# Patient Record
Sex: Female | Born: 1945 | Race: White | Hispanic: No | Marital: Married | Smoking: Never smoker
Health system: Southern US, Community
[De-identification: ages and names within clinical notes are randomized; demographics above are authoritative.]

## PROBLEM LIST (undated history)

## (undated) DIAGNOSIS — C50911 Malignant neoplasm of unspecified site of right female breast: Secondary | ICD-10-CM

## (undated) DIAGNOSIS — Z923 Personal history of irradiation: Secondary | ICD-10-CM

## (undated) DIAGNOSIS — C50919 Malignant neoplasm of unspecified site of unspecified female breast: Secondary | ICD-10-CM

## (undated) DIAGNOSIS — Z9221 Personal history of antineoplastic chemotherapy: Secondary | ICD-10-CM

## (undated) HISTORY — DX: Malignant neoplasm of unspecified site of right female breast: C50.911

---

## 2001-04-18 ENCOUNTER — Encounter: Admission: RE | Admit: 2001-04-18 | Discharge: 2001-04-18 | Payer: Self-pay | Admitting: Family Medicine

## 2001-04-18 ENCOUNTER — Encounter: Payer: Self-pay | Admitting: Family Medicine

## 2001-07-24 ENCOUNTER — Ambulatory Visit (HOSPITAL_COMMUNITY): Admission: RE | Admit: 2001-07-24 | Discharge: 2001-07-24 | Payer: Self-pay | Admitting: Gastroenterology

## 2002-04-25 ENCOUNTER — Encounter: Admission: RE | Admit: 2002-04-25 | Discharge: 2002-04-25 | Payer: Self-pay | Admitting: Family Medicine

## 2002-04-25 ENCOUNTER — Encounter: Payer: Self-pay | Admitting: Family Medicine

## 2003-06-25 ENCOUNTER — Encounter: Admission: RE | Admit: 2003-06-25 | Discharge: 2003-06-25 | Payer: Self-pay | Admitting: Family Medicine

## 2003-12-19 ENCOUNTER — Encounter: Admission: RE | Admit: 2003-12-19 | Discharge: 2003-12-19 | Payer: Self-pay | Admitting: Family Medicine

## 2004-07-21 ENCOUNTER — Encounter: Admission: RE | Admit: 2004-07-21 | Discharge: 2004-07-21 | Payer: Self-pay | Admitting: Family Medicine

## 2005-09-12 ENCOUNTER — Encounter: Admission: RE | Admit: 2005-09-12 | Discharge: 2005-09-12 | Payer: Self-pay | Admitting: Family Medicine

## 2006-02-08 ENCOUNTER — Encounter: Admission: RE | Admit: 2006-02-08 | Discharge: 2006-02-08 | Payer: Self-pay | Admitting: Family Medicine

## 2006-09-18 ENCOUNTER — Encounter: Admission: RE | Admit: 2006-09-18 | Discharge: 2006-09-18 | Payer: Self-pay | Admitting: Family Medicine

## 2007-09-18 ENCOUNTER — Ambulatory Visit (HOSPITAL_COMMUNITY): Admission: RE | Admit: 2007-09-18 | Discharge: 2007-09-19 | Payer: Self-pay | Admitting: Obstetrics and Gynecology

## 2007-12-11 ENCOUNTER — Encounter: Admission: RE | Admit: 2007-12-11 | Discharge: 2007-12-11 | Payer: Self-pay | Admitting: Obstetrics and Gynecology

## 2008-12-11 ENCOUNTER — Encounter: Admission: RE | Admit: 2008-12-11 | Discharge: 2008-12-11 | Payer: Self-pay | Admitting: Obstetrics and Gynecology

## 2009-04-16 ENCOUNTER — Encounter: Admission: RE | Admit: 2009-04-16 | Discharge: 2009-04-16 | Payer: Self-pay | Admitting: Family Medicine

## 2009-07-25 DIAGNOSIS — Z9221 Personal history of antineoplastic chemotherapy: Secondary | ICD-10-CM

## 2009-07-25 DIAGNOSIS — Z923 Personal history of irradiation: Secondary | ICD-10-CM

## 2009-07-25 DIAGNOSIS — C50919 Malignant neoplasm of unspecified site of unspecified female breast: Secondary | ICD-10-CM

## 2009-07-25 HISTORY — DX: Personal history of antineoplastic chemotherapy: Z92.21

## 2009-07-25 HISTORY — DX: Malignant neoplasm of unspecified site of unspecified female breast: C50.919

## 2009-07-25 HISTORY — DX: Personal history of irradiation: Z92.3

## 2009-07-25 HISTORY — PX: BREAST LUMPECTOMY: SHX2

## 2009-08-14 ENCOUNTER — Encounter: Admission: RE | Admit: 2009-08-14 | Discharge: 2009-08-14 | Payer: Self-pay | Admitting: Obstetrics and Gynecology

## 2009-08-21 ENCOUNTER — Encounter: Admission: RE | Admit: 2009-08-21 | Discharge: 2009-08-21 | Payer: Self-pay | Admitting: Obstetrics and Gynecology

## 2009-08-24 ENCOUNTER — Ambulatory Visit: Payer: Self-pay | Admitting: Oncology

## 2009-09-04 ENCOUNTER — Encounter: Admission: RE | Admit: 2009-09-04 | Discharge: 2009-09-04 | Payer: Self-pay | Admitting: Surgery

## 2009-09-04 ENCOUNTER — Ambulatory Visit (HOSPITAL_COMMUNITY): Admission: RE | Admit: 2009-09-04 | Discharge: 2009-09-04 | Payer: Self-pay | Admitting: Surgery

## 2009-09-09 LAB — COMPREHENSIVE METABOLIC PANEL
ALT: 56 U/L — ABNORMAL HIGH (ref 0–35)
BUN: 17 mg/dL (ref 6–23)
CO2: 28 mEq/L (ref 19–32)
Calcium: 10.1 mg/dL (ref 8.4–10.5)
Chloride: 103 mEq/L (ref 96–112)
Creatinine, Ser: 0.73 mg/dL (ref 0.40–1.20)
Total Bilirubin: 0.5 mg/dL (ref 0.3–1.2)

## 2009-09-09 LAB — CBC WITH DIFFERENTIAL/PLATELET
Basophils Absolute: 0 10*3/uL (ref 0.0–0.1)
EOS%: 0.9 % (ref 0.0–7.0)
Eosinophils Absolute: 0 10*3/uL (ref 0.0–0.5)
HCT: 37.9 % (ref 34.8–46.6)
MCHC: 33.8 g/dL (ref 31.5–36.0)
MCV: 90.8 fL (ref 79.5–101.0)
Platelets: 183 10*3/uL (ref 145–400)
RDW: 14 % (ref 11.2–14.5)
WBC: 5.4 10*3/uL (ref 3.9–10.3)
lymph#: 0.8 10*3/uL — ABNORMAL LOW (ref 0.9–3.3)

## 2009-09-09 LAB — CANCER ANTIGEN 27.29: CA 27.29: 17 U/mL (ref 0–39)

## 2009-09-15 ENCOUNTER — Ambulatory Visit (HOSPITAL_COMMUNITY): Admission: RE | Admit: 2009-09-15 | Discharge: 2009-09-15 | Payer: Self-pay | Admitting: Oncology

## 2009-09-17 ENCOUNTER — Ambulatory Visit (HOSPITAL_COMMUNITY): Admission: RE | Admit: 2009-09-17 | Discharge: 2009-09-17 | Payer: Self-pay | Admitting: Oncology

## 2009-09-17 ENCOUNTER — Encounter: Payer: Self-pay | Admitting: Oncology

## 2009-09-23 ENCOUNTER — Ambulatory Visit (HOSPITAL_COMMUNITY): Admission: RE | Admit: 2009-09-23 | Discharge: 2009-09-23 | Payer: Self-pay | Admitting: Surgery

## 2009-09-25 ENCOUNTER — Ambulatory Visit: Payer: Self-pay | Admitting: Oncology

## 2009-10-13 LAB — CBC WITH DIFFERENTIAL/PLATELET
Basophils Absolute: 0 10*3/uL (ref 0.0–0.1)
EOS%: 2.3 % (ref 0.0–7.0)
Eosinophils Absolute: 0 10*3/uL (ref 0.0–0.5)
HCT: 34.4 % — ABNORMAL LOW (ref 34.8–46.6)
HGB: 11.2 g/dL — ABNORMAL LOW (ref 11.6–15.9)
MCH: 29.5 pg (ref 25.1–34.0)
MONO#: 0.3 10*3/uL (ref 0.1–0.9)
NEUT#: 0.7 10*3/uL — ABNORMAL LOW (ref 1.5–6.5)
NEUT%: 36.6 % — ABNORMAL LOW (ref 38.4–76.8)
RDW: 12.9 % (ref 11.2–14.5)
lymph#: 0.8 10*3/uL — ABNORMAL LOW (ref 0.9–3.3)

## 2009-10-20 LAB — CBC WITH DIFFERENTIAL/PLATELET
Basophils Absolute: 0 10*3/uL (ref 0.0–0.1)
Eosinophils Absolute: 0.1 10*3/uL (ref 0.0–0.5)
HCT: 35.7 % (ref 34.8–46.6)
HGB: 12 g/dL (ref 11.6–15.9)
LYMPH%: 14.7 % (ref 14.0–49.7)
MCV: 91.4 fL (ref 79.5–101.0)
MONO#: 0.6 10*3/uL (ref 0.1–0.9)
MONO%: 7 % (ref 0.0–14.0)
NEUT#: 6.3 10*3/uL (ref 1.5–6.5)
NEUT%: 76.9 % — ABNORMAL HIGH (ref 38.4–76.8)
Platelets: 218 10*3/uL (ref 145–400)
RBC: 3.91 10*6/uL (ref 3.70–5.45)
WBC: 8.2 10*3/uL (ref 3.9–10.3)

## 2009-10-26 ENCOUNTER — Ambulatory Visit: Payer: Self-pay | Admitting: Oncology

## 2009-10-26 LAB — CBC WITH DIFFERENTIAL/PLATELET
Basophils Absolute: 0 10*3/uL (ref 0.0–0.1)
EOS%: 0 % (ref 0.0–7.0)
Eosinophils Absolute: 0 10*3/uL (ref 0.0–0.5)
HCT: 35.8 % (ref 34.8–46.6)
HGB: 12 g/dL (ref 11.6–15.9)
MCH: 30.6 pg (ref 25.1–34.0)
NEUT%: 94.7 % — ABNORMAL HIGH (ref 38.4–76.8)
lymph#: 0.5 10*3/uL — ABNORMAL LOW (ref 0.9–3.3)

## 2009-10-27 LAB — COMPREHENSIVE METABOLIC PANEL
AST: 22 U/L (ref 0–37)
BUN: 17 mg/dL (ref 6–23)
CO2: 26 mEq/L (ref 19–32)
Calcium: 9.8 mg/dL (ref 8.4–10.5)
Chloride: 103 mEq/L (ref 96–112)
Creatinine, Ser: 0.69 mg/dL (ref 0.40–1.20)
Glucose, Bld: 100 mg/dL — ABNORMAL HIGH (ref 70–99)

## 2009-11-03 LAB — CBC WITH DIFFERENTIAL/PLATELET
Basophils Absolute: 0 10*3/uL (ref 0.0–0.1)
Eosinophils Absolute: 0 10*3/uL (ref 0.0–0.5)
HCT: 31.7 % — ABNORMAL LOW (ref 34.8–46.6)
HGB: 10.9 g/dL — ABNORMAL LOW (ref 11.6–15.9)
MONO#: 0.2 10*3/uL (ref 0.1–0.9)
NEUT#: 0.5 10*3/uL — ABNORMAL LOW (ref 1.5–6.5)
NEUT%: 32.4 % — ABNORMAL LOW (ref 38.4–76.8)
RDW: 14.3 % (ref 11.2–14.5)
WBC: 1.4 10*3/uL — ABNORMAL LOW (ref 3.9–10.3)
lymph#: 0.7 10*3/uL — ABNORMAL LOW (ref 0.9–3.3)

## 2009-11-17 LAB — CBC WITH DIFFERENTIAL/PLATELET
Basophils Absolute: 0 10*3/uL (ref 0.0–0.1)
EOS%: 0 % (ref 0.0–7.0)
Eosinophils Absolute: 0 10*3/uL (ref 0.0–0.5)
HCT: 35.1 % (ref 34.8–46.6)
HGB: 11.5 g/dL — ABNORMAL LOW (ref 11.6–15.9)
MCH: 30 pg (ref 25.1–34.0)
MCV: 91.6 fL (ref 79.5–101.0)
MONO%: 6.7 % (ref 0.0–14.0)
NEUT#: 11.5 10*3/uL — ABNORMAL HIGH (ref 1.5–6.5)
NEUT%: 86 % — ABNORMAL HIGH (ref 38.4–76.8)

## 2009-11-17 LAB — COMPREHENSIVE METABOLIC PANEL
AST: 20 U/L (ref 0–37)
Albumin: 4.3 g/dL (ref 3.5–5.2)
Alkaline Phosphatase: 51 U/L (ref 39–117)
BUN: 18 mg/dL (ref 6–23)
Calcium: 10 mg/dL (ref 8.4–10.5)
Chloride: 104 mEq/L (ref 96–112)
Creatinine, Ser: 0.67 mg/dL (ref 0.40–1.20)
Glucose, Bld: 98 mg/dL (ref 70–99)
Potassium: 4.1 mEq/L (ref 3.5–5.3)

## 2009-11-24 LAB — CBC WITH DIFFERENTIAL/PLATELET
Basophils Absolute: 0 10*3/uL (ref 0.0–0.1)
EOS%: 0.9 % (ref 0.0–7.0)
HCT: 30.9 % — ABNORMAL LOW (ref 34.8–46.6)
HGB: 10.5 g/dL — ABNORMAL LOW (ref 11.6–15.9)
MCH: 31.2 pg (ref 25.1–34.0)
MCV: 92 fL (ref 79.5–101.0)
MONO%: 19.3 % — ABNORMAL HIGH (ref 0.0–14.0)
NEUT%: 35.9 % — ABNORMAL LOW (ref 38.4–76.8)
Platelets: 79 10*3/uL — ABNORMAL LOW (ref 145–400)

## 2009-12-07 ENCOUNTER — Ambulatory Visit: Payer: Self-pay | Admitting: Oncology

## 2009-12-08 LAB — COMPREHENSIVE METABOLIC PANEL
BUN: 19 mg/dL (ref 6–23)
CO2: 22 mEq/L (ref 19–32)
Calcium: 8.5 mg/dL (ref 8.4–10.5)
Chloride: 108 mEq/L (ref 96–112)
Creatinine, Ser: 0.62 mg/dL (ref 0.40–1.20)

## 2009-12-08 LAB — CBC WITH DIFFERENTIAL/PLATELET
Basophils Absolute: 0 10*3/uL (ref 0.0–0.1)
HCT: 34 % — ABNORMAL LOW (ref 34.8–46.6)
HGB: 11.2 g/dL — ABNORMAL LOW (ref 11.6–15.9)
MCH: 30.7 pg (ref 25.1–34.0)
MONO#: 0.7 10*3/uL (ref 0.1–0.9)
NEUT%: 87.7 % — ABNORMAL HIGH (ref 38.4–76.8)
WBC: 12.2 10*3/uL — ABNORMAL HIGH (ref 3.9–10.3)
lymph#: 0.8 10*3/uL — ABNORMAL LOW (ref 0.9–3.3)

## 2009-12-15 LAB — CBC WITH DIFFERENTIAL/PLATELET
BASO%: 0.9 % (ref 0.0–2.0)
Basophils Absolute: 0 10*3/uL (ref 0.0–0.1)
HCT: 29.3 % — ABNORMAL LOW (ref 34.8–46.6)
HGB: 10.1 g/dL — ABNORMAL LOW (ref 11.6–15.9)
MONO#: 0.2 10*3/uL (ref 0.1–0.9)
NEUT%: 43.8 % (ref 38.4–76.8)
WBC: 1.2 10*3/uL — ABNORMAL LOW (ref 3.9–10.3)
lymph#: 0.4 10*3/uL — ABNORMAL LOW (ref 0.9–3.3)

## 2009-12-29 LAB — CBC WITH DIFFERENTIAL/PLATELET
BASO%: 0.3 % (ref 0.0–2.0)
EOS%: 0 % (ref 0.0–7.0)
HCT: 32.9 % — ABNORMAL LOW (ref 34.8–46.6)
LYMPH%: 5.6 % — ABNORMAL LOW (ref 14.0–49.7)
MCH: 30.3 pg (ref 25.1–34.0)
MCHC: 31.9 g/dL (ref 31.5–36.0)
MONO%: 4.1 % (ref 0.0–14.0)
NEUT%: 90 % — ABNORMAL HIGH (ref 38.4–76.8)
Platelets: 332 10*3/uL (ref 145–400)
RBC: 3.46 10*6/uL — ABNORMAL LOW (ref 3.70–5.45)
WBC: 7.8 10*3/uL (ref 3.9–10.3)
nRBC: 0 % (ref 0–0)

## 2009-12-29 LAB — COMPREHENSIVE METABOLIC PANEL
ALT: 16 U/L (ref 0–35)
AST: 22 U/L (ref 0–37)
Alkaline Phosphatase: 56 U/L (ref 39–117)
BUN: 11 mg/dL (ref 6–23)
Calcium: 9.5 mg/dL (ref 8.4–10.5)
Creatinine, Ser: 0.59 mg/dL (ref 0.40–1.20)
Total Bilirubin: 0.6 mg/dL (ref 0.3–1.2)

## 2010-01-05 LAB — CBC WITH DIFFERENTIAL/PLATELET
Basophils Absolute: 0 10*3/uL (ref 0.0–0.1)
EOS%: 3.1 % (ref 0.0–7.0)
HCT: 28 % — ABNORMAL LOW (ref 34.8–46.6)
HGB: 9.6 g/dL — ABNORMAL LOW (ref 11.6–15.9)
LYMPH%: 28 % (ref 14.0–49.7)
MCH: 32.2 pg (ref 25.1–34.0)
MCV: 93.8 fL (ref 79.5–101.0)
NEUT%: 51.1 % (ref 38.4–76.8)
Platelets: 100 10*3/uL — ABNORMAL LOW (ref 145–400)
lymph#: 0.4 10*3/uL — ABNORMAL LOW (ref 0.9–3.3)

## 2010-01-18 ENCOUNTER — Ambulatory Visit: Payer: Self-pay | Admitting: Oncology

## 2010-01-19 LAB — CBC WITH DIFFERENTIAL/PLATELET
Basophils Absolute: 0 10*3/uL (ref 0.0–0.1)
EOS%: 0 % (ref 0.0–7.0)
HCT: 32 % — ABNORMAL LOW (ref 34.8–46.6)
HGB: 10 g/dL — ABNORMAL LOW (ref 11.6–15.9)
MCH: 30 pg (ref 25.1–34.0)
MCHC: 31.3 g/dL — ABNORMAL LOW (ref 31.5–36.0)
MCV: 96.1 fL (ref 79.5–101.0)
MONO%: 7.6 % (ref 0.0–14.0)
NEUT%: 83.9 % — ABNORMAL HIGH (ref 38.4–76.8)
RDW: 15.1 % — ABNORMAL HIGH (ref 11.2–14.5)

## 2010-01-19 LAB — COMPREHENSIVE METABOLIC PANEL
AST: 23 U/L (ref 0–37)
Alkaline Phosphatase: 49 U/L (ref 39–117)
BUN: 14 mg/dL (ref 6–23)
Creatinine, Ser: 0.65 mg/dL (ref 0.40–1.20)

## 2010-01-27 LAB — COMPREHENSIVE METABOLIC PANEL
ALT: 8 U/L (ref 0–35)
AST: 10 U/L (ref 0–37)
CO2: 28 mEq/L (ref 19–32)
Calcium: 9 mg/dL (ref 8.4–10.5)
Chloride: 102 mEq/L (ref 96–112)
Potassium: 4.3 mEq/L (ref 3.5–5.3)
Sodium: 139 mEq/L (ref 135–145)
Total Protein: 5.4 g/dL — ABNORMAL LOW (ref 6.0–8.3)

## 2010-01-27 LAB — CBC WITH DIFFERENTIAL/PLATELET
BASO%: 0.6 % (ref 0.0–2.0)
Eosinophils Absolute: 0 10*3/uL (ref 0.0–0.5)
LYMPH%: 16 % (ref 14.0–49.7)
MCHC: 31.3 g/dL — ABNORMAL LOW (ref 31.5–36.0)
MONO#: 0.6 10*3/uL (ref 0.1–0.9)
NEUT#: 2.1 10*3/uL (ref 1.5–6.5)
Platelets: 82 10*3/uL — ABNORMAL LOW (ref 145–400)
RBC: 3.1 10*6/uL — ABNORMAL LOW (ref 3.70–5.45)
RDW: 14.5 % (ref 11.2–14.5)
WBC: 3.2 10*3/uL — ABNORMAL LOW (ref 3.9–10.3)
lymph#: 0.5 10*3/uL — ABNORMAL LOW (ref 0.9–3.3)
nRBC: 0 % (ref 0–0)

## 2010-02-02 LAB — CBC WITH DIFFERENTIAL/PLATELET
BASO%: 0.2 % (ref 0.0–2.0)
EOS%: 0.1 % (ref 0.0–7.0)
LYMPH%: 8.1 % — ABNORMAL LOW (ref 14.0–49.7)
MCH: 31.7 pg (ref 25.1–34.0)
MCHC: 33.4 g/dL (ref 31.5–36.0)
MONO#: 0.5 10*3/uL (ref 0.1–0.9)
NEUT%: 84.1 % — ABNORMAL HIGH (ref 38.4–76.8)
RBC: 3.25 10*6/uL — ABNORMAL LOW (ref 3.70–5.45)
WBC: 6.9 10*3/uL (ref 3.9–10.3)
lymph#: 0.6 10*3/uL — ABNORMAL LOW (ref 0.9–3.3)

## 2010-02-02 LAB — COMPREHENSIVE METABOLIC PANEL
ALT: 8 U/L (ref 0–35)
AST: 14 U/L (ref 0–37)
CO2: 27 mEq/L (ref 19–32)
Chloride: 104 mEq/L (ref 96–112)
Creatinine, Ser: 0.73 mg/dL (ref 0.40–1.20)
Sodium: 142 mEq/L (ref 135–145)
Total Bilirubin: 0.3 mg/dL (ref 0.3–1.2)
Total Protein: 6 g/dL (ref 6.0–8.3)

## 2010-02-16 ENCOUNTER — Ambulatory Visit: Admission: RE | Admit: 2010-02-16 | Discharge: 2010-04-21 | Payer: Self-pay | Admitting: Radiation Oncology

## 2010-04-03 ENCOUNTER — Inpatient Hospital Stay (HOSPITAL_COMMUNITY): Admission: EM | Admit: 2010-04-03 | Discharge: 2010-04-04 | Payer: Self-pay | Admitting: Emergency Medicine

## 2010-04-13 ENCOUNTER — Ambulatory Visit: Payer: Self-pay | Admitting: Oncology

## 2010-04-15 LAB — COMPREHENSIVE METABOLIC PANEL
ALT: 21 U/L (ref 0–35)
AST: 23 U/L (ref 0–37)
Alkaline Phosphatase: 78 U/L (ref 39–117)
CO2: 33 mEq/L — ABNORMAL HIGH (ref 19–32)
Creatinine, Ser: 0.75 mg/dL (ref 0.40–1.20)
Sodium: 142 mEq/L (ref 135–145)
Total Bilirubin: 0.7 mg/dL (ref 0.3–1.2)

## 2010-04-15 LAB — CBC WITH DIFFERENTIAL/PLATELET
Eosinophils Absolute: 0.1 10*3/uL (ref 0.0–0.5)
HGB: 12 g/dL (ref 11.6–15.9)
MCV: 89.9 fL (ref 79.5–101.0)
NEUT#: 3.1 10*3/uL (ref 1.5–6.5)
RBC: 4.04 10*6/uL (ref 3.70–5.45)
RDW: 13.5 % (ref 11.2–14.5)
WBC: 4.2 10*3/uL (ref 3.9–10.3)
lymph#: 0.7 10*3/uL — ABNORMAL LOW (ref 0.9–3.3)

## 2010-04-16 LAB — CANCER ANTIGEN 27.29: CA 27.29: 30 U/mL (ref 0–39)

## 2010-04-16 LAB — VITAMIN D 25 HYDROXY (VIT D DEFICIENCY, FRACTURES): Vit D, 25-Hydroxy: 52 ng/mL (ref 30–89)

## 2010-05-06 ENCOUNTER — Ambulatory Visit (HOSPITAL_COMMUNITY): Admission: RE | Admit: 2010-05-06 | Discharge: 2010-05-06 | Payer: Self-pay | Admitting: Surgery

## 2010-06-18 ENCOUNTER — Ambulatory Visit: Payer: Self-pay | Admitting: Oncology

## 2010-06-22 LAB — CBC WITH DIFFERENTIAL/PLATELET
Basophils Absolute: 0 10*3/uL (ref 0.0–0.1)
EOS%: 1.3 % (ref 0.0–7.0)
Eosinophils Absolute: 0.1 10*3/uL (ref 0.0–0.5)
HCT: 35 % (ref 34.8–46.6)
HGB: 11.9 g/dL (ref 11.6–15.9)
MCH: 30.5 pg (ref 25.1–34.0)
MONO#: 0.4 10*3/uL (ref 0.1–0.9)
NEUT#: 3.4 10*3/uL (ref 1.5–6.5)
NEUT%: 70.7 % (ref 38.4–76.8)
RDW: 13.7 % (ref 11.2–14.5)
lymph#: 0.9 10*3/uL (ref 0.9–3.3)

## 2010-08-14 ENCOUNTER — Other Ambulatory Visit: Payer: Self-pay | Admitting: Oncology

## 2010-08-14 DIAGNOSIS — Z853 Personal history of malignant neoplasm of breast: Secondary | ICD-10-CM

## 2010-08-14 DIAGNOSIS — Z780 Asymptomatic menopausal state: Secondary | ICD-10-CM

## 2010-10-07 LAB — CBC
Hemoglobin: 12 g/dL (ref 12.0–15.0)
Hemoglobin: 12.4 g/dL (ref 12.0–15.0)
MCH: 29.2 pg (ref 26.0–34.0)
MCH: 29.8 pg (ref 26.0–34.0)
MCV: 91 fL (ref 78.0–100.0)
Platelets: 130 10*3/uL — ABNORMAL LOW (ref 150–400)
RBC: 4.11 MIL/uL (ref 3.87–5.11)
RBC: 4.16 MIL/uL (ref 3.87–5.11)
WBC: 3.2 10*3/uL — ABNORMAL LOW (ref 4.0–10.5)
WBC: 3.5 10*3/uL — ABNORMAL LOW (ref 4.0–10.5)

## 2010-10-07 LAB — DIFFERENTIAL
Lymphocytes Relative: 15 % (ref 12–46)
Lymphs Abs: 0.5 10*3/uL — ABNORMAL LOW (ref 0.7–4.0)
Monocytes Relative: 11 % (ref 3–12)
Neutro Abs: 2.6 10*3/uL (ref 1.7–7.7)
Neutrophils Relative %: 72 % (ref 43–77)

## 2010-10-07 LAB — TYPE AND SCREEN
ABO/RH(D): A POS
Antibody Screen: NEGATIVE

## 2010-10-07 LAB — URINALYSIS, ROUTINE W REFLEX MICROSCOPIC
Ketones, ur: NEGATIVE mg/dL
Protein, ur: NEGATIVE mg/dL
Urobilinogen, UA: 0.2 mg/dL (ref 0.0–1.0)

## 2010-10-07 LAB — PROTIME-INR: Prothrombin Time: 14 seconds (ref 11.6–15.2)

## 2010-10-07 LAB — BASIC METABOLIC PANEL
CO2: 32 mEq/L (ref 19–32)
CO2: 29 mEq/L (ref 19–32)
Calcium: 9.4 mg/dL (ref 8.4–10.5)
Chloride: 103 mEq/L (ref 96–112)
Creatinine, Ser: 0.67 mg/dL (ref 0.4–1.2)
GFR calc Af Amer: >60 mL/min (ref >60)
GFR calc Af Amer: >60 mL/min (ref >60)
GFR calc non Af Amer: >60 mL/min (ref >60)
Potassium: 4.3 mEq/L (ref 3.5–5.1)
Sodium: 140 mEq/L (ref 135–145)
Sodium: 136 mEq/L (ref 135–145)

## 2010-10-07 LAB — SURGICAL PCR SCREEN: MRSA, PCR: NEGATIVE

## 2010-10-07 LAB — ABO/RH: ABO/RH(D): A POS

## 2010-10-07 LAB — APTT: aPTT: 35 seconds (ref 24–37)

## 2010-10-14 LAB — DIFFERENTIAL
Basophils Absolute: 0 10*3/uL (ref 0.0–0.1)
Basophils Relative: 0 % (ref 0–1)
Eosinophils Absolute: 0.1 10*3/uL (ref 0.0–0.7)
Eosinophils Relative: 1 % (ref 0–5)
Lymphocytes Relative: 26 % (ref 12–46)
Lymphs Abs: 1.5 10*3/uL (ref 0.7–4.0)
Monocytes Absolute: 0.4 10*3/uL (ref 0.1–1.0)
Monocytes Relative: 8 % (ref 3–12)
Neutro Abs: 3.8 10*3/uL (ref 1.7–7.7)
Neutrophils Relative %: 65 % (ref 43–77)

## 2010-10-14 LAB — CBC
HCT: 36.9 % (ref 36.0–46.0)
HCT: 38.7 % (ref 36.0–46.0)
Hemoglobin: 12.5 g/dL (ref 12.0–15.0)
Hemoglobin: 13.1 g/dL (ref 12.0–15.0)
MCHC: 33.8 g/dL (ref 30.0–36.0)
MCHC: 33.9 g/dL (ref 30.0–36.0)
MCV: 90.8 fL (ref 78.0–100.0)
MCV: 90.9 fL (ref 78.0–100.0)
Platelets: 159 10*3/uL (ref 150–400)
Platelets: 193 10*3/uL (ref 150–400)
RBC: 4.06 MIL/uL (ref 3.87–5.11)
RBC: 4.25 MIL/uL (ref 3.87–5.11)
RDW: 13.6 % (ref 11.5–15.5)
RDW: 13.7 % (ref 11.5–15.5)
WBC: 5.5 10*3/uL (ref 4.0–10.5)
WBC: 5.8 10*3/uL (ref 4.0–10.5)

## 2010-10-14 LAB — BASIC METABOLIC PANEL WITH GFR
BUN: 19 mg/dL (ref 6–23)
CO2: 33 meq/L — ABNORMAL HIGH (ref 19–32)
Calcium: 9.9 mg/dL (ref 8.4–10.5)
Chloride: 103 meq/L (ref 96–112)
Creatinine, Ser: 0.76 mg/dL (ref 0.4–1.2)
GFR calc non Af Amer: >60 mL/min
Glucose, Bld: 112 mg/dL — ABNORMAL HIGH (ref 70–99)
Potassium: 4.3 meq/L (ref 3.5–5.1)
Sodium: 140 meq/L (ref 135–145)

## 2010-10-14 LAB — COMPREHENSIVE METABOLIC PANEL WITH GFR
ALT: 18 U/L (ref 0–35)
AST: 27 U/L (ref 0–37)
Albumin: 4.4 g/dL (ref 3.5–5.2)
Alkaline Phosphatase: 56 U/L (ref 39–117)
BUN: 15 mg/dL (ref 6–23)
CO2: 31 meq/L (ref 19–32)
Calcium: 9.9 mg/dL (ref 8.4–10.5)
Chloride: 101 meq/L (ref 96–112)
Creatinine, Ser: 0.7 mg/dL (ref 0.4–1.2)
GFR calc non Af Amer: >60 mL/min
Glucose, Bld: 82 mg/dL (ref 70–99)
Potassium: 3.3 meq/L — ABNORMAL LOW (ref 3.5–5.1)
Sodium: 141 meq/L (ref 135–145)
Total Bilirubin: 0.4 mg/dL (ref 0.3–1.2)
Total Protein: 7 g/dL (ref 6.0–8.3)

## 2010-10-15 LAB — GLUCOSE, CAPILLARY: Glucose-Capillary: 118 mg/dL — ABNORMAL HIGH (ref 70–99)

## 2010-11-22 ENCOUNTER — Ambulatory Visit: Payer: Medicare Other | Attending: Radiation Oncology | Admitting: Radiation Oncology

## 2010-12-07 NOTE — H&P (Signed)
NAME:  Deborah Rose, Deborah Rose NO.:  [id number]   MEDICAL RECORD NO.:  [id number]          PATIENT TYPE:  AMB   LOCATION:  [facility]C                           FACILITY:  [facility]   PHYSICIAN:  Randye Lobo, M.D.   DATE OF BIRTH:  1946/01/22   DATE OF ADMISSION:  DATE OF DISCHARGE:                              HISTORY & PHYSICAL   CHIEF COMPLAINT:  Urinary incontinence.   HISTORY OF PRESENT ILLNESS:  The patient is a 65 year old, para 2,  Caucasian female, status post total vaginal hysterectomy with bilateral  salpingo-oophorectomy for abnormal uterine bleeding, who now presents  reporting urinary incontinence.  The patient experiences incontinence of  urine with coughing, laughing and sneezing.  She does require the use of  absorbent pads.  The patient would like surgical treatment of the  incontinence.   The patient has undergone multichannel urodynamic testing in the office  which has confirmed the presence of genuine stress incontinence with a  leak point pressure of 63 cm of water  The patient's uroflowmetry  studies, she had a void of 224 mL with postvoid residual of 50 mL.   PAST OBSTETRIC AND GYNECOLOGIC HISTORY:  Remarkable for two prior  vaginal deliveries.  The patient is not taking any hormone therapy.  The  patient's last Pap smear was performed in December 2008, and this showed  acute inflammation and no evidence of epithelial lesion or malignancy.  The patient's last mammogram was performed in January 2008 and was  within normal limits.  The patient is not taking any hormonal therapy.   PAST MEDICAL HISTORY:  1. Osteopenia.  2. Hypertension.  3. The patient has donated blood on September 03, 2007, to the general      population.   PAST SURGICAL HISTORY:  1. Status post total vaginal hysterectomy with bilateral salpingo-      oophorectomy.  2. Status post appendectomy.   MEDICATIONS:  1. Hydrochlorothiazide 50 mg p.o. daily.  2. Metoprolol 25 mg p.o.  daily.  3. Alendronate sodium 70 mg p.o. weekly.   ALLERGIES:  No known drug allergies.   SOCIAL HISTORY:  The patient is married for 44 years.  She is a  Futures trader.  She denies the use of tobacco, alcohol or illicit drugs.   FAMILY HISTORY:  Positive for colon cancer in the patient's mother,  positive for hypertension in the patient's mother, sister and brother.   REVIEW OF SYSTEMS:  The patient denies a history of constipation or  fecal incontinence.   PHYSICAL EXAMINATION:  VITAL SIGNS:  Height 5 feet 6 inches, weight 154  pounds. Blood pressure 121/72.  HEENT:  Normocephalic, atraumatic.  LUNGS:  Clear to auscultation bilaterally.  HEART:  S1-S2 with a regular rate and rhythm.  ABDOMEN:  Soft and nontender without evidence of hepatosplenomegaly or  organomegaly.  PELVIC:  Normal external genitalia and urethra.  There is a minimal  cystocele.  The cervix is absent.  There is good support of the vaginal  vault and the posterior vaginal vault.  Bimanual examination  demonstrates no midline nor adnexal masses or tenderness.  IMPRESSION:  The patient is a 65 year old, para 2 female, status post  total vaginal hysterectomy with bilateral salpingo-oophorectomy, who has  urodynamically proven genuine stress incontinence.   PLAN:  The patient will undergo a tension-free vaginal tape mid urethral  sling and cystoscopy at the [facility] on September 18, 2007.  Risks, benefits, and alternatives have been discussed with  the patient who wishes to proceed.      Randye Lobo, M.D.  Electronically Signed     BES/MEDQ  D:  09/17/2007  T:  09/17/2007  Job:  [id number]

## 2010-12-07 NOTE — Op Note (Signed)
NAME:  Deborah Rose, Deborah Rose NO.:  [id number]   MEDICAL RECORD NO.:  [id number]          PATIENT TYPE:  AMB   LOCATION:  SDC                           [facility]ILITY:  [facility]   PHYSICIAN:  Randye Lobo, M.D.   DATE OF BIRTH:  1946/02/10   DATE OF PROCEDURE:  09/18/2007  DATE OF DISCHARGE:                               OPERATIVE REPORT   PREOPERATIVE DIAGNOSIS:  1. Genuine stress incontinence.  2. Incomplete vaginal prolapse.   POSTOPERATIVE DIAGNOSIS:  1. Genuine stress incontinence.  2. Incomplete vaginal prolapse.   PROCEDURE:  1. A tension-free vaginal tape mid urethral sling.  2. Cystoscopy.  3. Anterior colporrhaphy.   SURGEON:  Randye Lobo, M.D.   ASSISTANT:  Luvenia Redden, M.D.   ANESTHESIA:  General endotracheal.   IV FLUIDS:  2000 mL Ringer's lactate.   ESTIMATED BLOOD LOSS:  150 mL   URINE OUTPUT:  200 mL   COMPLICATIONS:  None.   INDICATIONS FOR THE PROCEDURE:  The patient is a 64 year old para 2  Caucasian female, status post total vaginal hysterectomy with bilateral  salpingo-oophorectomy who presented with urinary incontinence with  stressful maneuvers.  The patient desired surgical treatment, and she  underwent multichannel urodynamic testing which confirmed the presence  of genuine stress incontinence with a leak point pressure of 63 cm of  water.  A plan is now made to proceed with a tension-free vaginal tape  mid urethral sling and cystoscopy after risks, benefits, and  alternatives are reviewed.   FINDINGS:  Exam under anesthesia revealed a first-degree cystocele.  The  vaginal apex and the posterior vaginal walls were well supported.  The  cervix was surgically absent.  The cystoscopy demonstrated patent  ureters bilaterally, after the injection of indigo carmine dye IV.  The  bladder was visualized throughout 360 degrees and had a normal bladder  dome and trigone.  There was no evidence of foreign body in either the  bladder or the  urethra.   SPECIMENS:  None.   DESCRIPTION OF PROCEDURE:  The patient was reidentified in the  preoperative hold area.  She did receive ciprofloxacin 400 mg IV for  antibiotic prophylaxis.  The patient received both TED hose and PAS  stockings for DVT prophylaxis.  In the operating room, general  endotracheal anesthesia was induced and the patient was then placed in  the dorsal lithotomy position.  The lower abdomen, vagina, and perineum  were sterilely prepped and draped.  A Foley catheter was placed inside  the bladder.   An examination under anesthesia was performed and the findings are as  noted above.   A weighted speculum was placed inside the vagina and Allis clamps were  placed along the anterior vaginal wall in the midline.  The vaginal  mucosa was injected locally with 1/2% lidocaine with 1:200,000 of  epinephrine.  The vaginal mucosa was then incised vertically in the  midline with a scalpel.  Sharp dissection was used to dissect the  bladder and subvaginal tissue off of the overlying vaginal mucosa.  Hemostasis, during the dissection, was achieved with monopolar cautery.  The dissection was carried to the level of the pubic rami, and down to  the level of the vaginal cuff.   The sling was performed in a top-down fashion.  Then 1 cm suprapubic  incisions were created 2 cm to the right-and-left in the midline.  The  abdominal needle passer was placed through the right suprapubic  incision, and then out through the vagina at the level of the mid  urethra on the patient's ipsilateral side.  The same procedure that was  performed on the right-hand side was then repeated on the left-hand  side.   A Foley catheter was removed at this time and cystoscopy was performed,  and the findings are as noted above.  The cystoscopy fluid was drained,  and the Foley catheter was replaced.  The sling was then attached to the  abdominal needle passers, and was drawn up through the  suprapubic  incisions bilaterally.  The sling was adjusted, and then the plastic  sheaths were removed from around the sling as a Kelly clamp was placed  between the sling and the urethra.  The sling was noted to be in good  position.  The excess sling material was trimmed suprapubically.   The anterior colporrhaphy was then performed with vertical mattress  sutures of 2-0 Vicryl for excellent reduction of the cystocele.  Excess  vaginal mucosa was trimmed, and the anterior vaginal wall was closed  with a running locked suture of 2-0 Vicryl.  The suprapubic incisions  were closed with Dermabond.  A gauze packing with Estrace cream was  placed vaginally.   The patient was cleansed with Betadine.  She was awakened and extubated,  and taken out of the dorsal lithotomy position.  She was escorted to the  recovery room in stable and awake condition.  There were no  complications to the procedure.  All needle, instrument, and sponge  counts were correct.      Randye Lobo, M.D.  Electronically Signed     BES/MEDQ  D:  09/18/2007  T:  09/18/2007  Job:  [id number]

## 2010-12-14 ENCOUNTER — Other Ambulatory Visit: Payer: Self-pay | Admitting: Oncology

## 2010-12-14 ENCOUNTER — Encounter: Payer: Medicare Other | Admitting: Oncology

## 2010-12-14 LAB — CBC WITH DIFFERENTIAL/PLATELET
BASO%: 0.4 % (ref 0.0–2.0)
Basophils Absolute: 0 10*3/uL (ref 0.0–0.1)
EOS%: 1.4 % (ref 0.0–7.0)
Eosinophils Absolute: 0.1 10*3/uL (ref 0.0–0.5)
HCT: 35.5 % (ref 34.8–46.6)
HGB: 12.3 g/dL (ref 11.6–15.9)
LYMPH%: 15.2 % (ref 14.0–49.7)
MCH: 31 pg (ref 25.1–34.0)
MCHC: 34.7 g/dL (ref 31.5–36.0)
MCV: 89.2 fL (ref 79.5–101.0)
MONO#: 0.2 10*3/uL (ref 0.1–0.9)
MONO%: 4.6 % (ref 0.0–14.0)
NEUT#: 3.7 10*3/uL (ref 1.5–6.5)
NEUT%: 78.4 % — ABNORMAL HIGH (ref 38.4–76.8)
Platelets: 153 10*3/uL (ref 145–400)
RBC: 3.98 10*6/uL (ref 3.70–5.45)
RDW: 13.1 % (ref 11.2–14.5)
WBC: 4.8 10*3/uL (ref 3.9–10.3)
lymph#: 0.7 10*3/uL — ABNORMAL LOW (ref 0.9–3.3)

## 2010-12-14 LAB — COMPREHENSIVE METABOLIC PANEL WITH GFR
ALT: 16 U/L (ref 0–35)
AST: 21 U/L (ref 0–37)
Albumin: 4.4 g/dL (ref 3.5–5.2)
Alkaline Phosphatase: 77 U/L (ref 39–117)
BUN: 13 mg/dL (ref 6–23)
CO2: 25 meq/L (ref 19–32)
Calcium: 9.7 mg/dL (ref 8.4–10.5)
Chloride: 103 meq/L (ref 96–112)
Creatinine, Ser: 0.79 mg/dL (ref 0.40–1.20)
Glucose, Bld: 132 mg/dL — ABNORMAL HIGH (ref 70–99)
Potassium: 3.8 meq/L (ref 3.5–5.3)
Sodium: 142 meq/L (ref 135–145)
Total Bilirubin: 0.4 mg/dL (ref 0.3–1.2)
Total Protein: 6.4 g/dL (ref 6.0–8.3)

## 2010-12-14 LAB — CANCER ANTIGEN 27.29: CA 27.29: 29 U/mL (ref 0–39)

## 2010-12-14 LAB — VITAMIN D 25 HYDROXY (VIT D DEFICIENCY, FRACTURES): Vit D, 25-Hydroxy: 57 ng/mL (ref 30–89)

## 2010-12-21 ENCOUNTER — Encounter (HOSPITAL_BASED_OUTPATIENT_CLINIC_OR_DEPARTMENT_OTHER): Payer: Medicare Other | Admitting: Oncology

## 2010-12-21 ENCOUNTER — Other Ambulatory Visit: Payer: Self-pay | Admitting: Oncology

## 2010-12-21 DIAGNOSIS — Z171 Estrogen receptor negative status [ER-]: Secondary | ICD-10-CM

## 2010-12-21 DIAGNOSIS — C50919 Malignant neoplasm of unspecified site of unspecified female breast: Secondary | ICD-10-CM

## 2010-12-28 ENCOUNTER — Ambulatory Visit
Admission: RE | Admit: 2010-12-28 | Discharge: 2010-12-28 | Disposition: A | Discharge status: Home / Self Care | Payer: Medicare Other | Source: Ambulatory Visit | Attending: Oncology | Admitting: Oncology

## 2010-12-28 ENCOUNTER — Other Ambulatory Visit: Payer: Self-pay | Admitting: Oncology

## 2010-12-28 DIAGNOSIS — C50919 Malignant neoplasm of unspecified site of unspecified female breast: Secondary | ICD-10-CM

## 2011-04-15 LAB — URINALYSIS, ROUTINE W REFLEX MICROSCOPIC
Bilirubin Urine: NEGATIVE
Glucose, UA: NEGATIVE
Hgb urine dipstick: NEGATIVE
Specific Gravity, Urine: <1.005 — ABNORMAL LOW
Urobilinogen, UA: 0.2

## 2011-04-15 LAB — COMPREHENSIVE METABOLIC PANEL
AST: 22
Albumin: 3.9
Alkaline Phosphatase: 53
Chloride: 103
Creatinine, Ser: 0.82
GFR calc Af Amer: >60
Potassium: 3.9
Total Bilirubin: 0.5
Total Protein: 6.7

## 2011-04-15 LAB — BASIC METABOLIC PANEL
CO2: 28
Calcium: 8.6
Chloride: 107
Glucose, Bld: 96
Sodium: 140

## 2011-04-15 LAB — CBC
HCT: 29.2 — ABNORMAL LOW
Hemoglobin: 10.1 — ABNORMAL LOW
Hemoglobin: 11.6 — ABNORMAL LOW
MCHC: 34.6
MCV: 87.1
RBC: 3.35 — ABNORMAL LOW
RBC: 3.89
RDW: 14.2
WBC: 5.5

## 2011-04-18 ENCOUNTER — Ambulatory Visit
Admission: RE | Admit: 2011-04-18 | Discharge: 2011-04-18 | Disposition: A | Discharge status: Home / Self Care | Payer: Medicare Other | Source: Ambulatory Visit | Attending: Oncology | Admitting: Oncology

## 2011-04-18 DIAGNOSIS — Z853 Personal history of malignant neoplasm of breast: Secondary | ICD-10-CM

## 2011-04-18 DIAGNOSIS — Z780 Asymptomatic menopausal state: Secondary | ICD-10-CM

## 2011-06-03 ENCOUNTER — Telehealth: Payer: Self-pay | Admitting: *Deleted

## 2011-06-03 NOTE — Telephone Encounter (Signed)
Patient confirmed moved appointment of 08-01-2011 starting at 8:15am.

## 2011-07-25 ENCOUNTER — Other Ambulatory Visit: Payer: Self-pay | Admitting: Physician Assistant

## 2011-07-25 ENCOUNTER — Encounter: Payer: Self-pay | Admitting: Physician Assistant

## 2011-07-25 DIAGNOSIS — C50911 Malignant neoplasm of unspecified site of right female breast: Secondary | ICD-10-CM

## 2011-07-25 HISTORY — DX: Malignant neoplasm of unspecified site of right female breast: C50.911

## 2011-08-01 ENCOUNTER — Other Ambulatory Visit: Payer: Self-pay | Admitting: Oncology

## 2011-08-01 ENCOUNTER — Encounter: Payer: Self-pay | Admitting: Physician Assistant

## 2011-08-01 ENCOUNTER — Telehealth: Payer: Self-pay | Admitting: Oncology

## 2011-08-01 ENCOUNTER — Other Ambulatory Visit (HOSPITAL_BASED_OUTPATIENT_CLINIC_OR_DEPARTMENT_OTHER): Payer: Medicare Other | Admitting: Lab

## 2011-08-01 ENCOUNTER — Ambulatory Visit (HOSPITAL_BASED_OUTPATIENT_CLINIC_OR_DEPARTMENT_OTHER): Payer: Medicare Other | Admitting: Physician Assistant

## 2011-08-01 VITALS — BP 163/93 | HR 103 | Temp 97.6°F | Ht 64.5 in | Wt 154.3 lb

## 2011-08-01 DIAGNOSIS — C50919 Malignant neoplasm of unspecified site of unspecified female breast: Secondary | ICD-10-CM

## 2011-08-01 DIAGNOSIS — M899 Disorder of bone, unspecified: Secondary | ICD-10-CM

## 2011-08-01 DIAGNOSIS — C50419 Malignant neoplasm of upper-outer quadrant of unspecified female breast: Secondary | ICD-10-CM

## 2011-08-01 DIAGNOSIS — Z171 Estrogen receptor negative status [ER-]: Secondary | ICD-10-CM

## 2011-08-01 LAB — COMPREHENSIVE METABOLIC PANEL
AST: 20 U/L (ref 0–37)
Albumin: 4.4 g/dL (ref 3.5–5.2)
Alkaline Phosphatase: 75 U/L (ref 39–117)
Glucose, Bld: 88 mg/dL (ref 70–99)
Potassium: 3.8 mEq/L (ref 3.5–5.3)
Sodium: 143 mEq/L (ref 135–145)
Total Bilirubin: 0.5 mg/dL (ref 0.3–1.2)
Total Protein: 6.5 g/dL (ref 6.0–8.3)

## 2011-08-01 LAB — CBC WITH DIFFERENTIAL/PLATELET
BASO%: 0.4 % (ref 0.0–2.0)
EOS%: 2 % (ref 0.0–7.0)
LYMPH%: 21.8 % (ref 14.0–49.7)
MCH: 30.7 pg (ref 25.1–34.0)
MCHC: 34 g/dL (ref 31.5–36.0)
MCV: 90.4 fL (ref 79.5–101.0)
MONO%: 7.8 % (ref 0.0–14.0)
NEUT#: 3.3 10*3/uL (ref 1.5–6.5)
Platelets: 177 10*3/uL (ref 145–400)
RBC: 4.2 10*6/uL (ref 3.70–5.45)
RDW: 12.6 % (ref 11.2–14.5)

## 2011-08-01 MED ORDER — LETROZOLE 2.5 MG PO TABS: 2.5000 mg | ORAL_TABLET | Freq: Every day | ORAL | Status: DC

## 2011-08-01 MED ORDER — ALENDRONATE SODIUM 35 MG PO TABS: 35.0000 mg | ORAL_TABLET | ORAL | Status: DC

## 2011-08-01 NOTE — Progress Notes (Signed)
Hematology and Oncology Follow Up Visit  Deborah Rose [id number] 06/23/1946 66 y.o. 08/01/2011    HPI: Deborah Rose palpated a mass in her right breast and brought it to Dr. Rica Records attention in early 2011.  The patient had had screening mammography in May of 2010, which had been unremarkable.  This time, however, on 08/14/2009, diagnostic mammography showed a developing density in the lateral aspect of the right breast.  The patient's breasts are dense, which of course limits sensitivity.  There were no calcifications of a malignant nature noted, and the left breast was unremarkable.  Dr. Judyann Munson was able to indeed palpate a discrete mass in the right breast, 3 cm from the nipple at the 9 o'clock position.  Ultrasound the same day showed a hypoechoic irregular mass measuring 1.6 cm.  The right axilla showed no enlarged lymph nodes.    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  The prognostic profile showed the tumor to be negative for estrogen and progesterone receptors (both zero percent).  The proliferation fraction was very high at 75%, HER-2 was not amplified with a ratio by CISH of 1.06.    With this information, the patient was referred to Dr. Luisa Hart, and bilateral breast MRIs were obtained on 08/21/2009.  The MRI showed an irregular enhancing mass in the posterolateral right breast involving both the upper outer and the lower outer quadrants.  It measured up to 5 cm craniocaudally.  The posterior aspect of the mass was seen to about the pectoralis muscle.  However, no definite involvement of the pectoralis with tumor was noted.  There was no suspicious enhancement on the left side, no skin thickening, and no suspicious axillary or internal mammary lymph nodes on either side.    The decision was made to proceed with surgery, and on 09/05/2009, the patient underwent right partial  mastectomy with sentinel lymph node mapping.  The final pathology from this procedure ([id number]) showed a 3.8-cm invasive ductal carcinoma involving the posterior margin.  There was no lymphovascular invasion, the tumor was read as grade 3, and favorably all 3 sentinel lymph nodes were negative.  The estrogen and progesterone receptors were repeated and the estrogen receptor was again negative.  However, positive the progesterone receptor was positive at 18%.  HER-2 remained negative with a ratio of 1.21.    The patient was referred to Dr. Pierce Crane for discussion of treatment options, and he suggested the patient consider B-46.  The patient was given the material to review, and  her decision regarding that is discussed below. As far as the margin is concerned, she underwent an attempt at margin clearance on 09/23/2009, at the same time that she had her port placed.  Unfortunately, the pathology from that procedure ([id number]) showed still invasive ductal carcinoma present at the new surgical resection margin along a broad front.  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   Deborah Rose received 6 cycles of docetaxel/doxorubicin/cyclophosphamide, given on a Q. three-week basis, and completed in June of 2011. It was decided that further resection would not be helpful. She proceeded, therefore, to radiation therapy which was completed in September 2011. At that time, she was started on letrozole, 2.5 mg daily on which she continues. The plan is to continue letrozole for total of 5 years.   Interim History:   Deborah Rose returns today for routine six-month followup of her right breast  carcinoma. Interval history is unremarkable, and she is feeling well. She continues on letrozole daily, her only side effects being some occasional hot flashes and some joint pain in the morning that resolves with movement. She's had no increased joint pain. She denies any vaginal dryness, discharge, or  bleeding. Her energy level is good. She is walking on a regular basis for exercise. She's taking a calcium supplement. Her bone density in September did in fact show osteopenia which we discussed today. She has taken Fosamax in the past with good tolerance.  Deborah Rose is up-to-date with her health maintenance, including routine physical and screening colonoscopy.  A detailed review of systems is otherwise noncontributory as noted below.  Review of Systems: Constitutional:  no weight loss, fever, night sweats and feels well Eyes: negative [id number] Cardiovascular: no chest pain or dyspnea on exertion Respiratory: no cough, shortness of breath, or wheezing Neurological: no TIA or stroke symptoms negative Dermatological: negative Gastrointestinal: no abdominal pain, change in bowel habits, or black or bloody stools Genito-Urinary: no dysuria, trouble voiding, or hematuria Hematological and Lymphatic: negative Breast: negative Musculoskeletal: positive for - mild joint pain Remaining ROS negative.  PAST MEDICAL HISTORY:  1) Hypertension.  2) Status post hysterectomy and bilateral salpingo-oophorectomy in another city in 2000 for fibroids.  3) History of appendectomy.  4) History of "bladder uptuck".    FAMILY HISTORY:  The patient's father died from asbestosis, was a former Financial controller at the age of 77.  The patient's mother is alive at age 27.  The patient has 2 sisters and 1 brother.  There is no family history of breast or ovarian cancer to her knowledge.    GYNECOLOGIC HISTORY:  She is GX, P2.  First pregnancy to term age 21.  The patient never took hormone replacement.  SOCIAL HISTORY:  Deborah Rose used to work in [employer].  Her husband, Daron Offer, now retired, used to work for [employer] as Occupational psychologist.  The daughter, Baxter Hire, is a housewife and substitute Runner, broadcasting/film/video. Their daughter, Harvin Hazel, works for [employer].  Both daughters live in [place].  The  patient has grandchildren and attends a [employer].  Medications:   I have reviewed the patient's current medications.  Current Outpatient Prescriptions  Medication Sig Dispense Refill  . acetaminophen (TYLENOL) 500 MG tablet Take 1,000 mg by mouth every 6 (six) hours as needed.        Marland Kitchen aspirin 81 MG tablet Take 81 mg by mouth daily.        . calcium-vitamin D (OSCAL WITH D) 500-200 MG-UNIT per tablet Take 1 tablet by mouth daily.        Marland Kitchen letrozole (FEMARA) 2.5 MG tablet Take 1 tablet (2.5 mg total) by mouth daily.  90 tablet  3  . metoprolol succinate (TOPROL-XL) 25 MG 24 hr tablet Take 25 mg by mouth daily.        . Multiple Vitamin (MULTIVITAMIN) tablet Take 1 tablet by mouth daily.        Marland Kitchen olmesartan-hydrochlorothiazide (BENICAR HCT) 40-12.5 MG per tablet Take 1 tablet by mouth daily.        Marland Kitchen alendronate (FOSAMAX) 35 MG tablet Take 1 tablet (35 mg total) by mouth every 7 (seven) days. Take with a full glass of water on an empty stomach.  3 tablet  3    Allergies: No Known Allergies  Physical Exam: Filed Vitals:   08/01/11 0853  BP: 163/93  Pulse: 103  Temp: 97.6 F (  36.4 C)   HEENT:  Sclerae anicteric, conjunctivae pink.  Oropharynx clear.  No mucositis or candidiasis.   Nodes:  No cervical, supraclavicular, or axillary lymphadenopathy palpated.  Breast Exam:  Right breast, status post lumpectomy with well-healed incision. No suspicious nodularities, skin changes, or evidence of local recurrence. Left breast is benign, no masses, discharge, skin change, or nipple inversion. Breast tissue is dense bilaterally. Lungs:  Clear to auscultation bilaterally.  No crackles, rhonchi, or wheezes.   Heart:  Regular rate and rhythm.   Abdomen:  Soft, nontender.  Positive bowel sounds.  No organomegaly or masses palpated.   Musculoskeletal:  No focal spinal tenderness to palpation.  Extremities:  Benign.  No peripheral edema or cyanosis.   Skin:  Benign.   Neuro:  Nonfocal.   Lab  Results: Lab Results  Component Value Date   WBC 4.9 08/01/2011   HGB 12.9 08/01/2011   HCT 37.9 08/01/2011   MCV 90.4 08/01/2011   PLT 177 08/01/2011   NEUTROABS 3.3 08/01/2011     Chemistry      Component Value Date/Time   NA 142 12/14/2010 1355   K 3.8 12/14/2010 1355   CL 103 12/14/2010 1355   CO2 25 12/14/2010 1355   BUN 13 12/14/2010 1355   CREATININE 0.79 12/14/2010 1355      Component Value Date/Time   CALCIUM 9.7 12/14/2010 1355   ALKPHOS 77 12/14/2010 1355   AST 21 12/14/2010 1355   ALT 16 12/14/2010 1355   BILITOT 0.4 12/14/2010 1355        Radiological Studies:   12/28/2010 DIGITAL DIAGNOSTIC BILATERAL MAMMOGRAM WITH CAD  Comparison: 08/14/2009  Findings: The breast tissue is heterogeneously dense. Right  lumpectomy changes are present. There is no dominant mass,  nonsurgical architectural distortion or calcification to suggest  malignancy.  Mammographic images were processed with CAD.  IMPRESSION:  No mammographic evidence of malignancy. Yearly diagnostic  mammography is suggested.  BI-RADS CATEGORY 2: Benign finding(s).  Original Report Authenticated By: Daryl Eastern, M.D   04/18/2011 DUAL X-RAY ABSORPTIOMETRY (DXA) FOR BONE MINERAL DENSITY  AP LUMBAR SPINE (L1 - L4)  Bone Mineral Density (BMD): 0.872 g/cm2  Young Adult T Score: -1.6  Z Score: 0.2  LEFT FEMUR (NECK)  Bone Mineral Density (BMD): 0.622 g/cm2  Young Adult T Score: -2.0  Z Score: -0.5  ASSESSMENT: Patient's diagnostic category is LOW BONE MASS by WHO  Criteria.  FRAX: Based on the World Health Organization FRAX model, the 10  year probability of a major osteoporotic fracture is 18%. The 10  year probability of a hip fracture is 2.9%.  Comparison: There has been a statistically significant 2.8%  decrease in BMD in the spine and a statistically significant 3.8%  increase in BMD in the total left hip as compared to 12/19/2003.  There has been a statistically significant 5.7% decrease in BMD in    the spine and a statistically significant 3.6% increase in BMD in  the total left hip as compared to 04/16/2009.   Assessment:  66 year old [place] woman, status post right lumpectomy and sentinel lymph node dissection in February 2011 for a T2 N0 grade 3 invasive ductal carcinoma. Tumor was ER negative, PR positive at 18%, and HER-2/neu negative. Status post 6 every 3 week doses of docetaxel/doxorubicin/cyclophosphamide, completed June of 2011 and followed by radiation. Radiation was completed in September 2011, at which time patient was started on letrozole, 2.5 mg daily. The plan is to continue for total of  5 years  Plan:  Lashanna appears to be doing well, and will continue on her letrozole as before, again the goal being to complete a total of 5 years. With the osteopenia and some decrease in bone mineral density, she would like to start back on Fosamax, 35 mg weekly. As noted above she has tolerated this well in the past. She has had no problems with good. She has regular dental exams, and has had no recent dental procedures or extractions.  Patient will return in 6 months for followup with Dr. Darnelle Catalan. Prior to that visit, in June of 2013, she will have her annual a lateral mammogram. At her next visit, we will also discuss the possibility of MRIs on an every other year basis.   This plan was reviewed with the patient, who voices understanding and agreement.  She knows to call with any changes or problems.    Yailin Biederman, PA-C 08/01/2011

## 2011-08-01 NOTE — Telephone Encounter (Signed)
Gv pt appt for [id number].  scheduled pt for mammogram on [id number]

## 2011-09-05 IMAGING — CR DG ANKLE COMPLETE 3+V*R*
3 series · 3 of 3 positions shown · non-contrast
Comparison: None.

CLINICAL DATA: Fall.  Ankle pain and fracture.

RIGHT ANKLE - COMPLETE 3+ VIEW

[t ankle joint ap right]
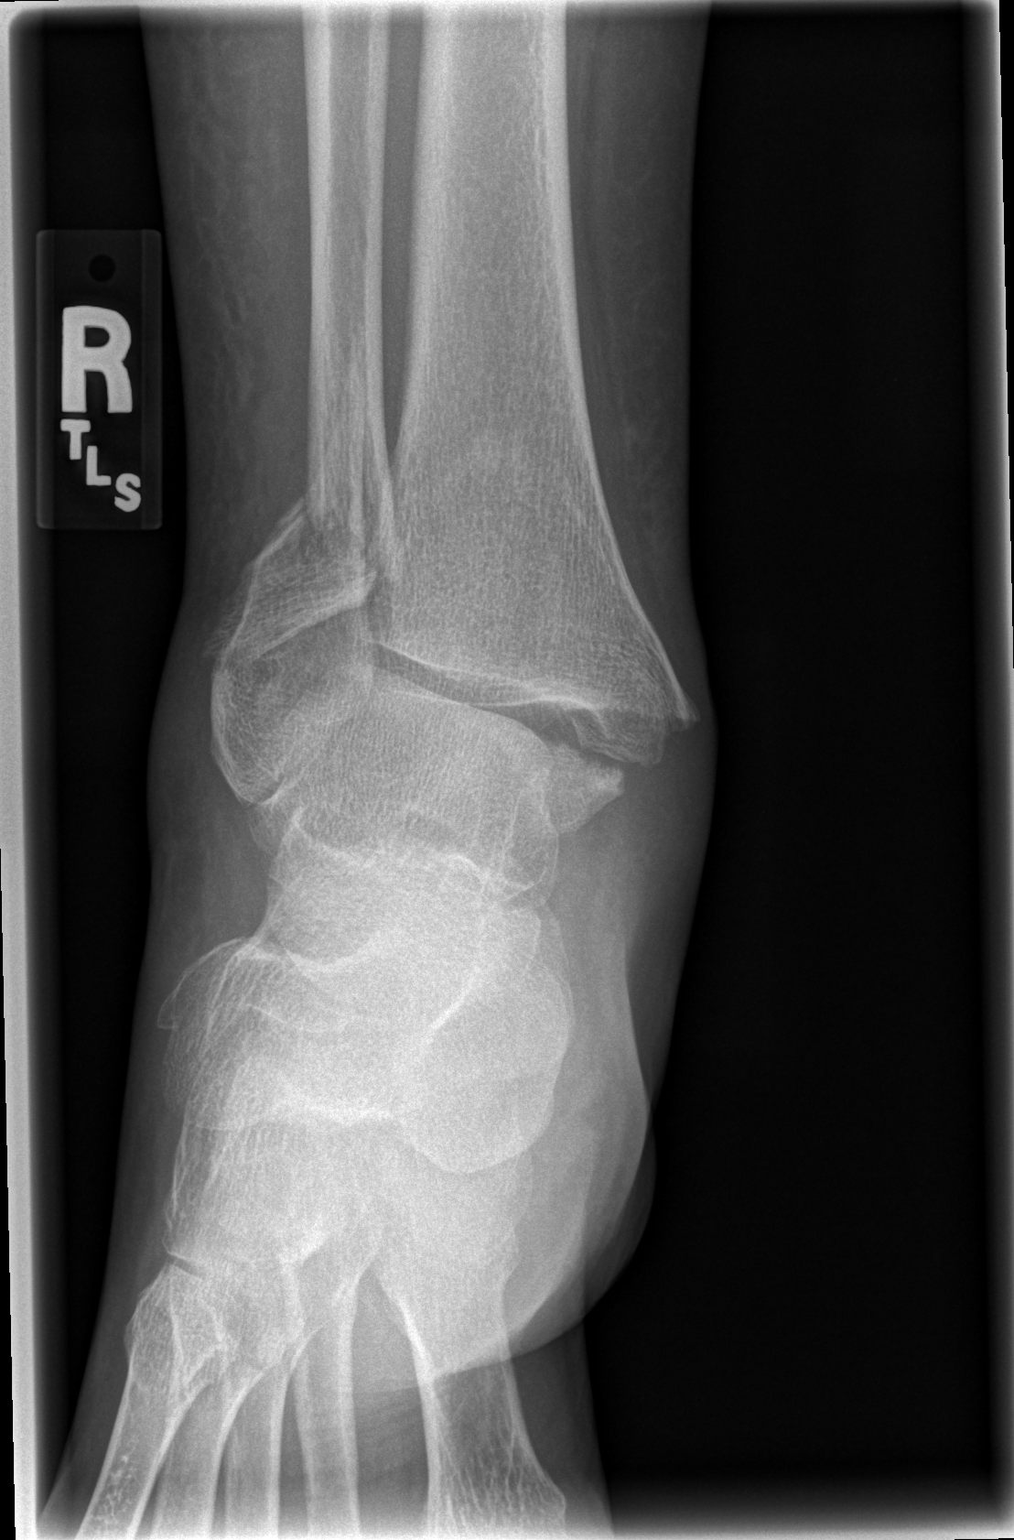

[t ankle joint oblique right]
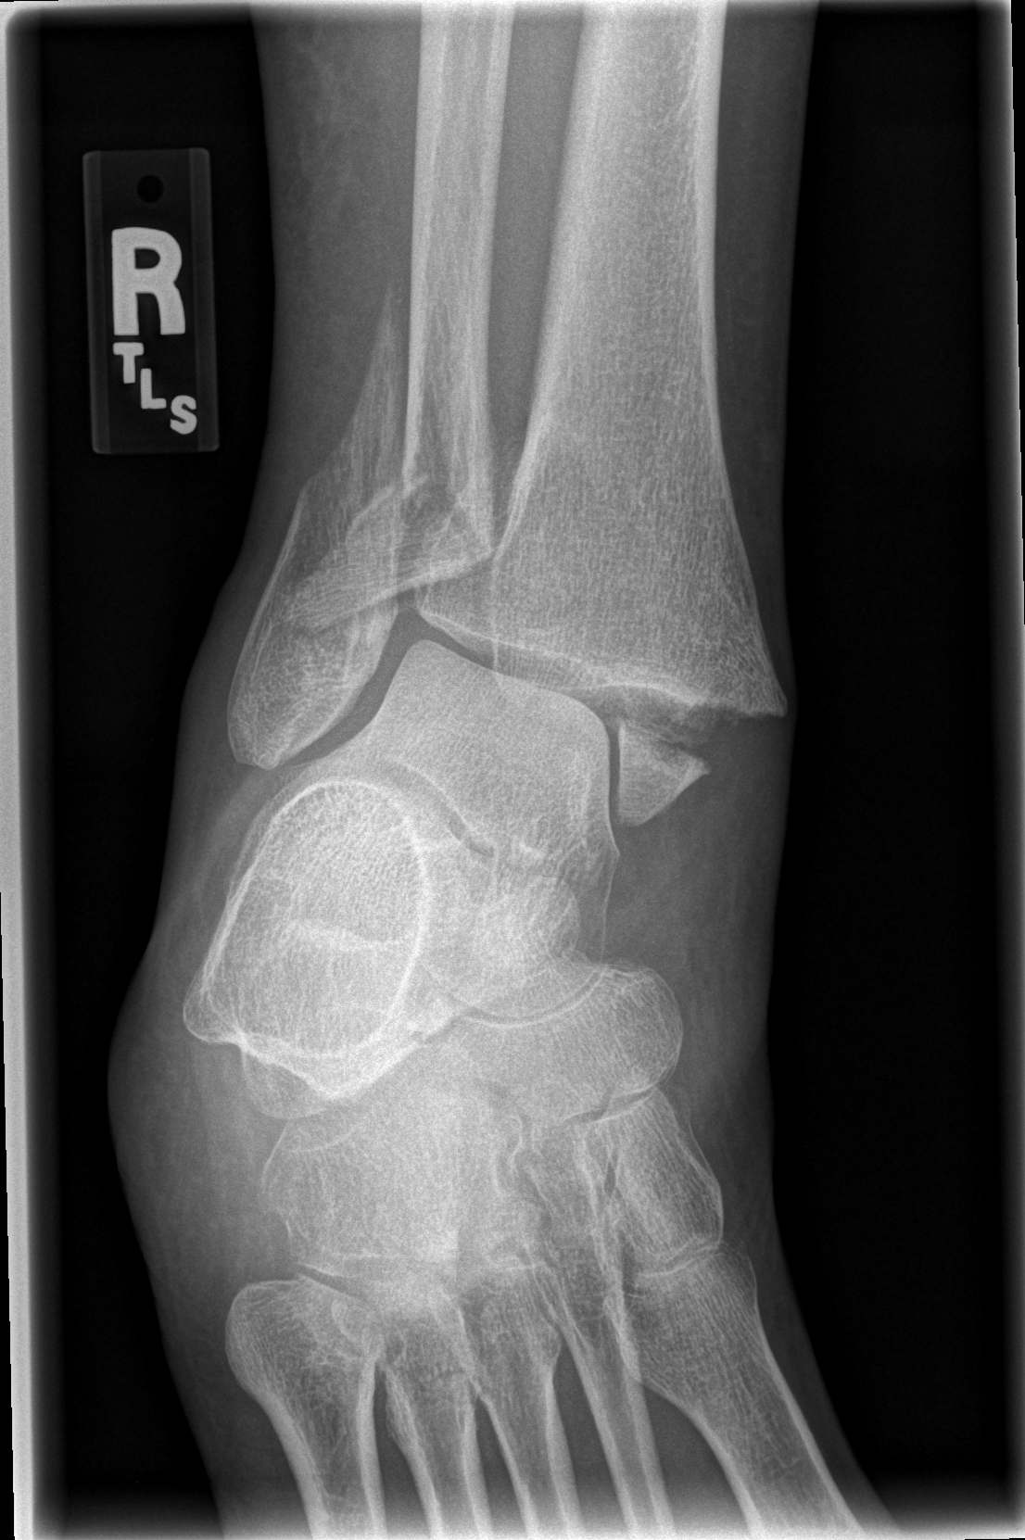

[t ankle joint lat right]
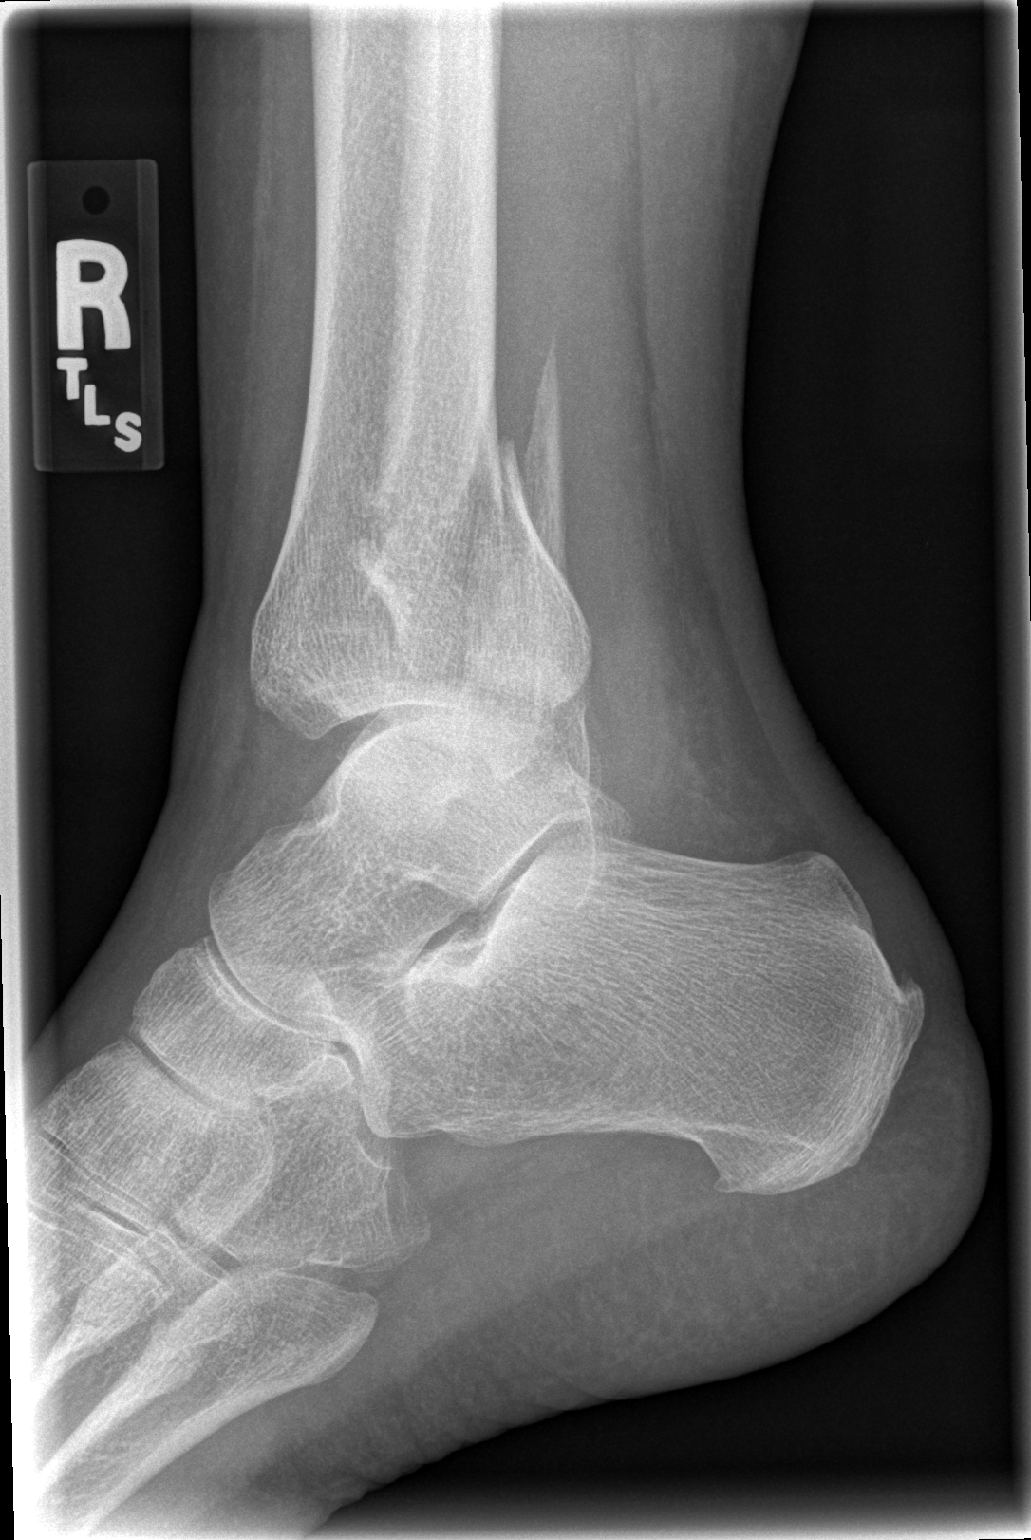

[3 of 3 positions shown; findings below may reference images not displayed]

FINDINGS: Trimalleolar ankle fracture is seen.  Posterior and
lateral subluxation of the talus is seen.  Associated soft tissue
swelling also noted.
IMPRESSION: Trimalleolar ankle fracture, with posterior and lateral subluxation
of the talus.

## 2011-09-06 IMAGING — CR DG TIBIA/FIBULA PORT 2V*R*
4 series · 4 of 4 positions shown · non-contrast
Comparison: Earlier same day.

CLINICAL DATA: right ankle fracture

PORTABLE RIGHT TIBIA AND FIBULA - 2 VIEW

[AP (1 of 2)]
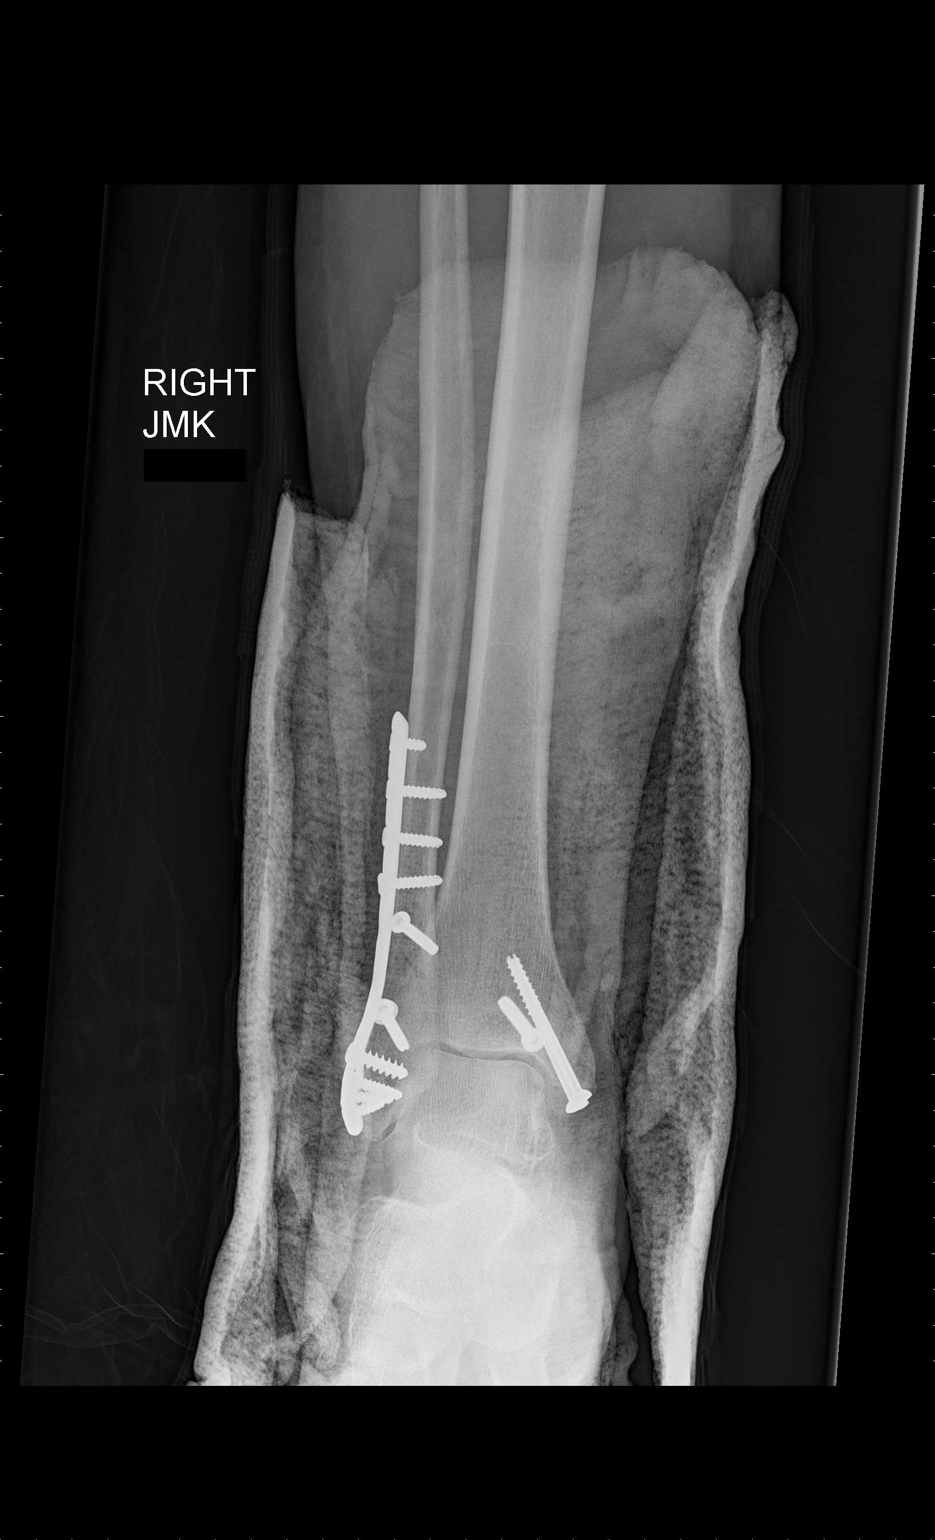

[AP (2 of 2)]
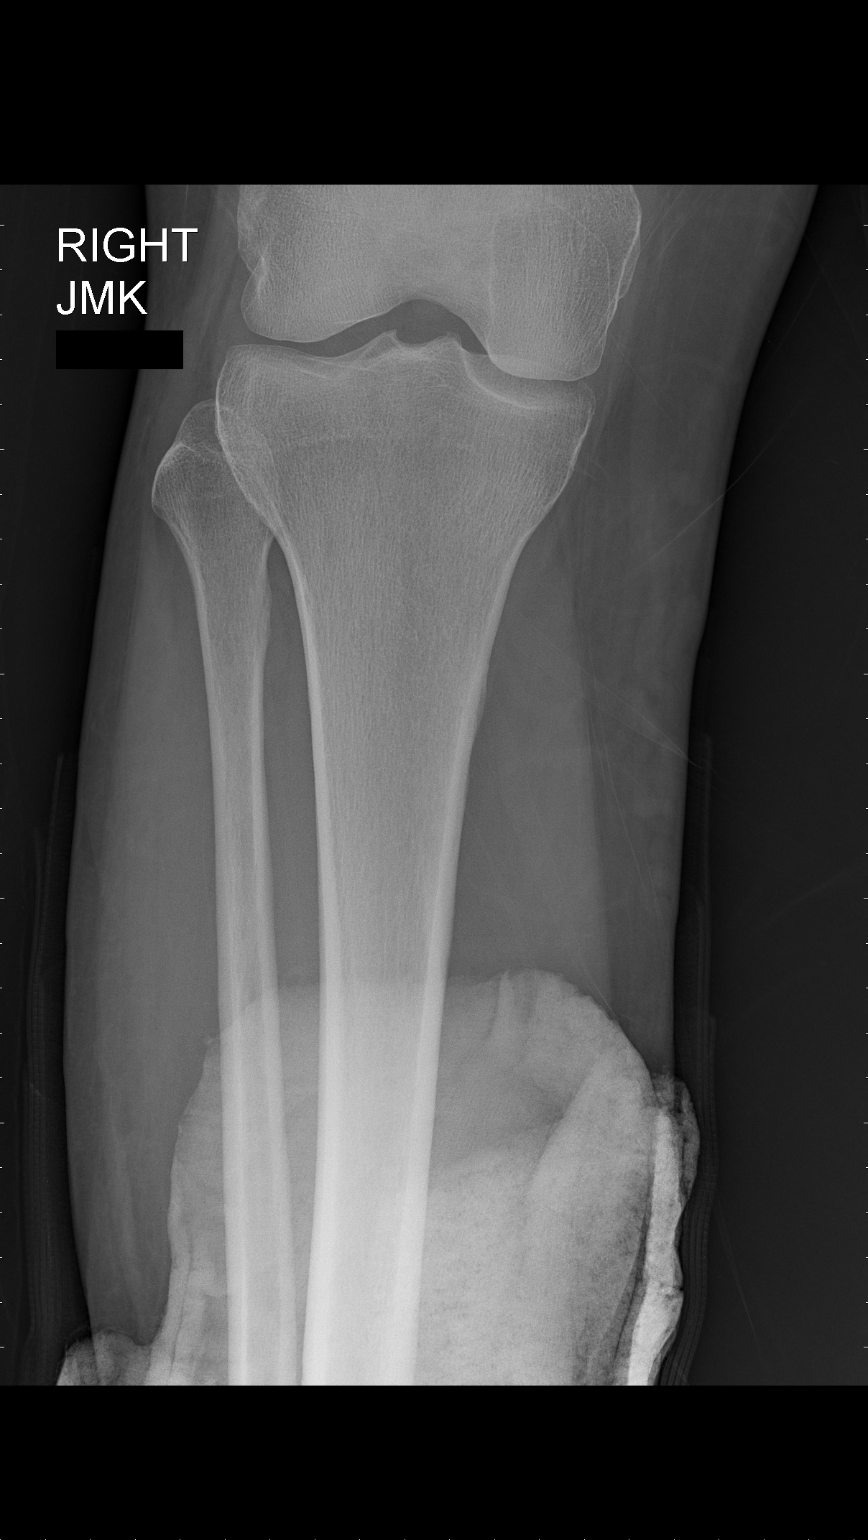

[lat tib/fib (1 of 2)]
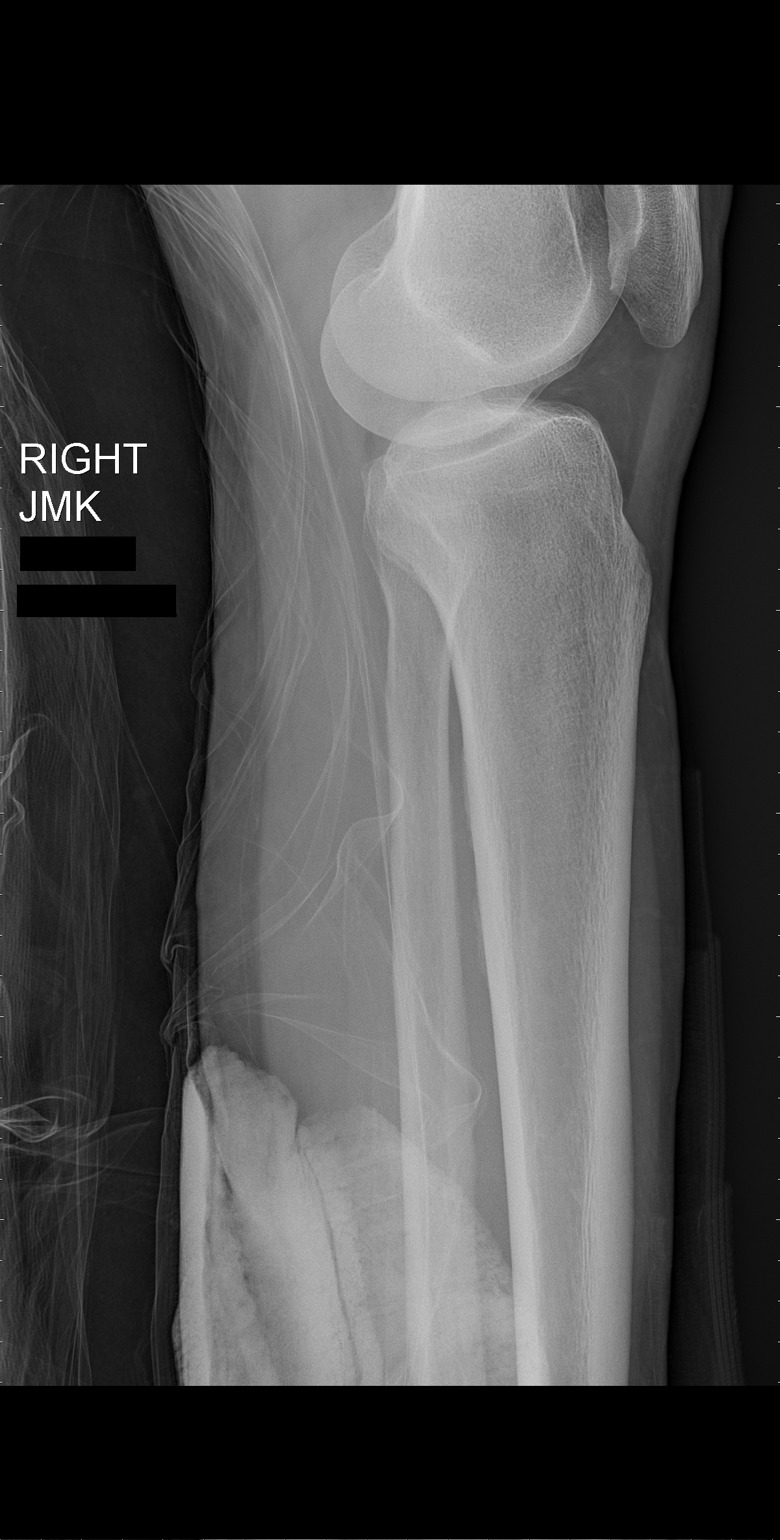

[lat tib/fib (2 of 2)]
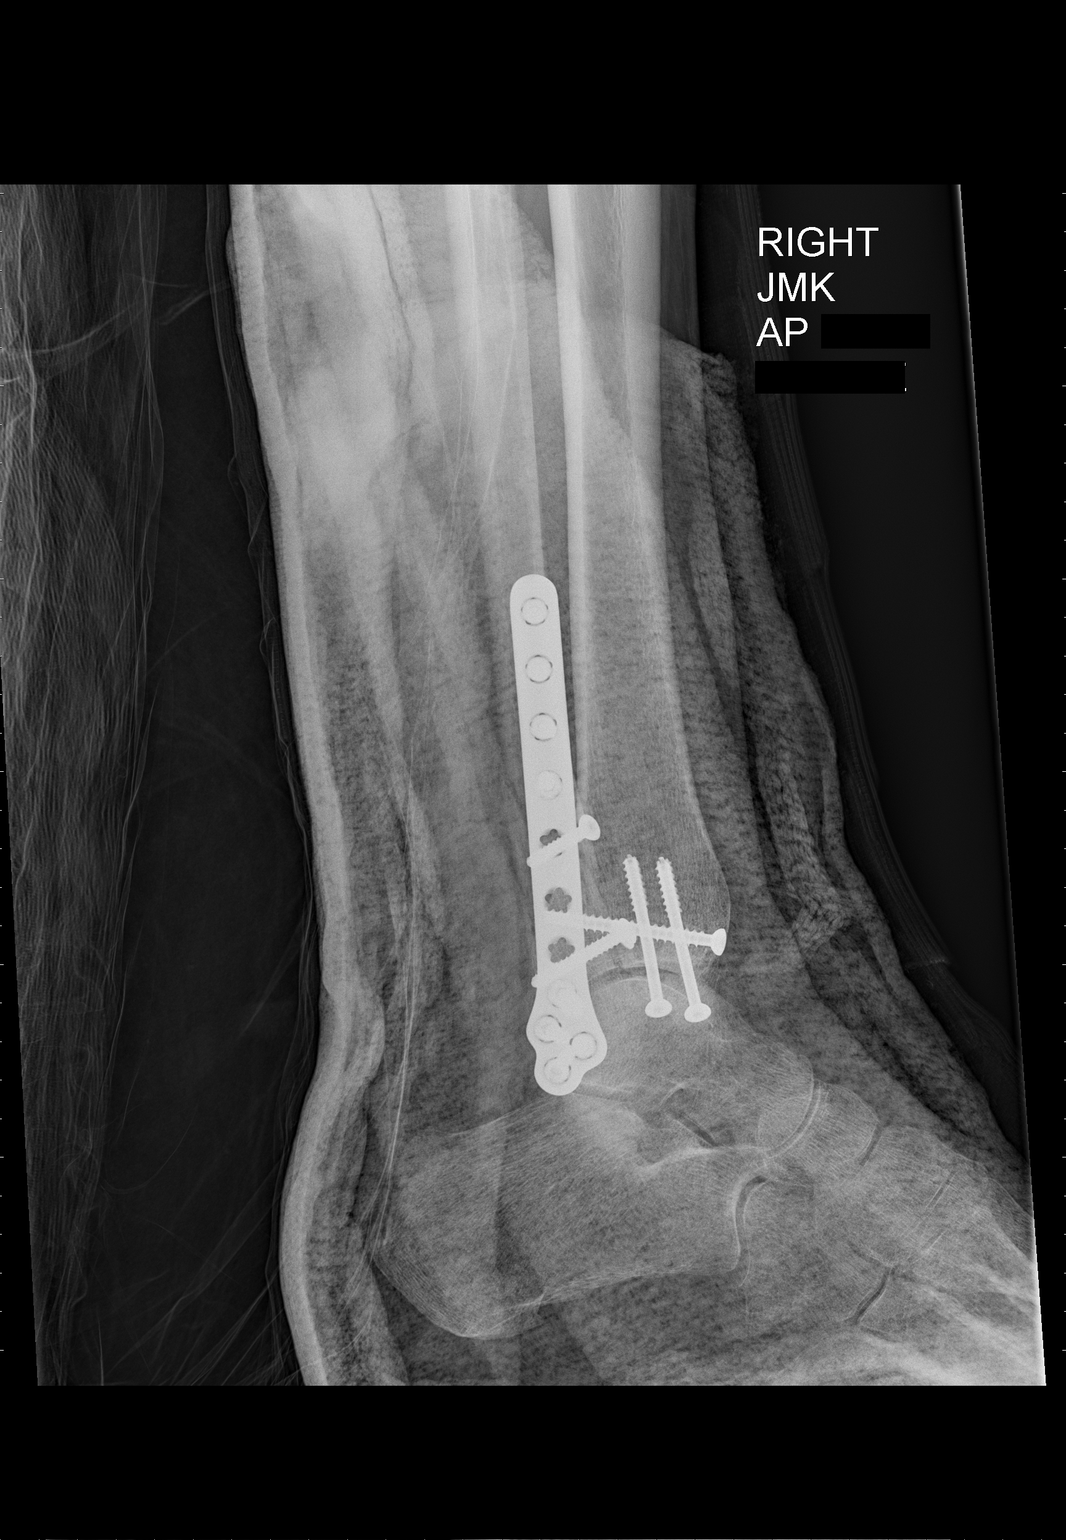

[4 of 4 positions shown; findings below may reference images not displayed]

FINDINGS: Interval open reduction internal fixation of the
trimalleolar fracture with significant improved in alignment.  No
immediate hardware complication is seen.  The remainder of the
right tibia and fibula are normal.
IMPRESSION: Improved alignment status post fixation of ankle fracture.

## 2011-11-21 ENCOUNTER — Other Ambulatory Visit: Payer: Self-pay | Admitting: Physician Assistant

## 2012-01-09 ENCOUNTER — Ambulatory Visit
Admission: RE | Admit: 2012-01-09 | Discharge: 2012-01-09 | Disposition: A | Discharge status: Home / Self Care | Payer: Medicare Other | Source: Ambulatory Visit | Attending: Physician Assistant | Admitting: Physician Assistant

## 2012-01-09 DIAGNOSIS — C50919 Malignant neoplasm of unspecified site of unspecified female breast: Secondary | ICD-10-CM

## 2012-01-11 ENCOUNTER — Encounter: Payer: Self-pay | Admitting: Oncology

## 2012-01-11 ENCOUNTER — Telehealth: Payer: Self-pay | Admitting: *Deleted

## 2012-01-11 NOTE — Progress Notes (Signed)
Called [facility] @  [id number] letrozole 2.5mg  has been approved from 01/11/12-01/10/13.

## 2012-01-11 NOTE — Telephone Encounter (Signed)
[facility] at [facility] faxed prior authorization request for letrozole.  Request to managed care for review.

## 2012-02-06 ENCOUNTER — Other Ambulatory Visit (HOSPITAL_BASED_OUTPATIENT_CLINIC_OR_DEPARTMENT_OTHER): Payer: Medicare Other | Admitting: Lab

## 2012-02-06 DIAGNOSIS — C50919 Malignant neoplasm of unspecified site of unspecified female breast: Secondary | ICD-10-CM

## 2012-02-06 LAB — CBC WITH DIFFERENTIAL/PLATELET
BASO%: 0.5 % (ref 0.0–2.0)
Basophils Absolute: 0 10*3/uL (ref 0.0–0.1)
HCT: 38.6 % (ref 34.8–46.6)
HGB: 12.8 g/dL (ref 11.6–15.9)
LYMPH%: 20.4 % (ref 14.0–49.7)
MCH: 29.9 pg (ref 25.1–34.0)
MCHC: 33.3 g/dL (ref 31.5–36.0)
MONO#: 0.4 10*3/uL (ref 0.1–0.9)
NEUT%: 69.4 % (ref 38.4–76.8)
Platelets: 161 10*3/uL (ref 145–400)
WBC: 4.5 10*3/uL (ref 3.9–10.3)
lymph#: 0.9 10*3/uL (ref 0.9–3.3)

## 2012-02-06 LAB — COMPREHENSIVE METABOLIC PANEL
BUN: 17 mg/dL (ref 6–23)
CO2: 30 mEq/L (ref 19–32)
Calcium: 9.8 mg/dL (ref 8.4–10.5)
Chloride: 103 mEq/L (ref 96–112)
Creatinine, Ser: 0.77 mg/dL (ref 0.50–1.10)
Total Bilirubin: 0.5 mg/dL (ref 0.3–1.2)

## 2012-02-06 LAB — CANCER ANTIGEN 27.29: CA 27.29: 26 U/mL (ref 0–39)

## 2012-02-13 ENCOUNTER — Ambulatory Visit (HOSPITAL_BASED_OUTPATIENT_CLINIC_OR_DEPARTMENT_OTHER): Payer: Medicare Other | Admitting: Oncology

## 2012-02-13 ENCOUNTER — Telehealth: Payer: Self-pay | Admitting: Oncology

## 2012-02-13 VITALS — BP 163/97 | HR 118 | Temp 98.8°F | Ht 64.5 in | Wt 156.6 lb

## 2012-02-13 DIAGNOSIS — C50919 Malignant neoplasm of unspecified site of unspecified female breast: Secondary | ICD-10-CM

## 2012-02-13 DIAGNOSIS — C50911 Malignant neoplasm of unspecified site of right female breast: Secondary | ICD-10-CM

## 2012-02-13 NOTE — Progress Notes (Signed)
ID: [name]   DOB: 29-Mar-1946  MR#: [id number]  CSN#:[id number]  HISTORY OF PRESENT ILLNESS: Mahina palpated a mass in her right breast and brought it to Dr. Rica Records attention in early 2011. The patient had had screening mammography in May of 2010, which had been unremarkable. This time, however, on 08/14/2009, diagnostic mammography showed a developing density in the lateral aspect of the right breast. The patient's breasts are dense, which of course limits sensitivity. There were no calcifications of a malignant nature noted, and the left breast was unremarkable. Dr. Judyann Munson was able to indeed palpate a discrete mass in the right breast, 3 cm from the nipple at the 9 o'clock position. Ultrasound the same day showed a hypoechoic irregular mass measuring 1.6 cm. The right axilla showed no enlarged lymph nodes.  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 The prognostic profile showed the tumor to be negative for estrogen and progesterone receptors (both zero percent). The proliferation fraction was very high at 75%, HER-2 was not amplified with a ratio by CISH of 1.06.  Bilateral breast MRIs were obtained on 08/21/2009. The MRI showed an irregular enhancing mass in the posterolateral right breast involving both the upper outer and the lower outer quadrants. It measured up to 5 cm craniocaudally. The posterior aspect of the mass was seen to about the pectoralis muscle. However, no definite involvement of the pectoralis with tumor was noted. There was no suspicious enhancement on the left side, no skin thickening, and no suspicious axillary or internal mammary lymph nodes on either side.  On 09/05/2009, the patient underwent right partial mastectomy with sentinel lymph node mapping. The final pathology from this procedure ([id number]) showed a 3.8-cm invasive ductal carcinoma involving the posterior  margin. There was no lymphovascular invasion, the tumor was read as grade 3, and favorably all 3 sentinel lymph nodes were negative. The estrogen and progesterone receptors were repeated and the estrogen receptor was again negative. However, the progesterone receptor was positive at 18%. HER-2 remained negative with a ratio of 1.21.  The patient was referred to Dr. Pierce Crane for discussion of treatment options, and he suggested the patient consider B-46. The patient was given the material to review, and her decision regarding that is discussed below. As far as the margin is concerned, she underwent an attempt at margin clearance on 09/23/2009, at the same time that she had her port placed. Unfortunately, the pathology from that procedure ([id number]) showed invasive ductal carcinoma still  present at the new surgical resection margin along a broad front. 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  Wynonia received 6 cycles of  Docetaxel /doxorubicin /cyclophosphamide, given on a Q. three-week basis, and completed in June of 2011. It was decided that further resection would not be helpful. Her subsequent history is as detailed below  INTERVAL HISTORY: Natilie returns for routine followup of her breast cancer. Interval history is generally unremarkable. She works part-time at [employer], volunteers at her church, and keeps her house and garden she has 4 grandchildren all nearby.  REVIEW OF SYSTEMS: The biggest problem she is having is some hair loss. This is definitely going to be related to the letrozole. She's also having some flashes, but they're not bad enough for her to want to do anything about. She is a little bit of the "morning gel phenomenon", but that clears fairly quickly when she gets  moving. Vaginal dryness does not seem to be a problem and she is tolerating of the Fosamax without any side effects that she is aware of. She does have a little bit of low back pain: this is  very intermittent and not more intense or frequent than before. A detailed review of systems was otherwise noncontributory   PAST MEDICAL HISTORY: Past Medical History  Diagnosis Date  . Breast cancer, right breast 07/25/2011  1) Hypertension. 2) Status post hysterectomy and bilateral salpingo-oophorectomy in another city in 2000 for fibroids. 3) History of appendectomy. 4) History of "bladder uptuck".    PAST SURGICAL HISTORY: No past surgical history on file.  FAMILY HISTORY No family history on file. The patient's father died from asbestosis, was a former Financial controller at the age of 35. The patient's mother is alive in her 41's. The patient has 2 sisters and 1 brother. There is no family history of breast or ovarian cancer to her knowledge  GYNECOLOGIC HISTORY: She is GX, P2. First pregnancy to term age 65. The patient never took hormone replacement.  SOCIAL HISTORY: Lior used to work in [employer]. Her husband, Daron Offer, now retired, used to work for [employer] as Occupational psychologist. The daughter, Baxter Hire, is a housewife and substitute Runner, broadcasting/film/video. Their daughter, Harvin Hazel, works for [employer]. Both daughters live in [place]. The patient has grandchildren and attends a [employer].    ADVANCED DIRECTIVES:  HEALTH MAINTENANCE: History  Substance Use Topics  . Smoking status: Never Smoker   . Smokeless tobacco: Never Used  . Alcohol Use: 0.6 oz/week    1 Glasses of wine per week     Colonoscopy:  PAP:  Bone density:  Lipid panel:  No Known Allergies  Current Outpatient Prescriptions  Medication Sig Dispense Refill  . alendronate (FOSAMAX) 35 MG tablet TAKE 1 TABLET BY MOUTH EVERY 7 DAYS WITH A FULL GLASS OF WATER ON AN EMPTY STOMACH  4 tablet  3  . aspirin 81 MG tablet Take 81 mg by mouth daily.        . calcium-vitamin D (OSCAL WITH D) 500-200 MG-UNIT per tablet Take 1 tablet by mouth daily.        Marland Kitchen letrozole (FEMARA) 2.5 MG tablet Take  1 tablet (2.5 mg total) by mouth daily.  90 tablet  3  . metoprolol succinate (TOPROL-XL) 25 MG 24 hr tablet Take 25 mg by mouth daily.        . Multiple Vitamin (MULTIVITAMIN) tablet Take 1 tablet by mouth daily.        Marland Kitchen olmesartan-hydrochlorothiazide (BENICAR HCT) 40-12.5 MG per tablet Take 1 tablet by mouth daily.        Marland Kitchen acetaminophen (TYLENOL) 500 MG tablet Take 1,000 mg by mouth every 6 (six) hours as needed.          OBJECTIVE: Middle-aged white woman in no acute distress Filed Vitals:   02/13/12 1058  BP: 163/97  Pulse: 118  Temp: 98.8 F (37.1 C)     Body mass index is 26.47 kg/(m^2).    ECOG FS: 0  Sclerae unicteric Oropharynx clear No cervical or supraclavicular adenopathy Lungs no rales or rhonchi Heart regular rate and rhythm Abd benign MSK no focal spinal tenderness, no peripheral edema Neuro: nonfocal Breasts: The right breast is status post lumpectomy. There is no evidence of local recurrence. Left breast is unremarkable  LAB RESULTS: Lab Results  Component Value Date   WBC 4.5 02/06/2012   NEUTROABS  3.1 02/06/2012   HGB 12.8 02/06/2012   HCT 38.6 02/06/2012   MCV 89.9 02/06/2012   PLT 161 02/06/2012      Chemistry      Component Value Date/Time   NA 143 02/06/2012 0953   K 3.7 02/06/2012 0953   CL 103 02/06/2012 0953   CO2 30 02/06/2012 0953   BUN 17 02/06/2012 0953   CREATININE 0.77 02/06/2012 0953      Component Value Date/Time   CALCIUM 9.8 02/06/2012 0953   ALKPHOS 63 02/06/2012 0953   AST 20 02/06/2012 0953   ALT 14 02/06/2012 0953   BILITOT 0.5 02/06/2012 0953       Lab Results  Component Value Date   LABCA2 26 02/06/2012    No components found with this basename: [id number]    No results found for this basename: INR:1;PROTIME:1 in the last 168 hours  Urinalysis    Component Value Date/Time   COLORURINE YELLOW 04/03/2010 1940   APPEARANCEUR CLEAR 04/03/2010 1940   LABSPEC 1.010 04/03/2010 1940   PHURINE 6.5 04/03/2010 1940   GLUCOSEU NEGATIVE  04/03/2010 1940   HGBUR NEGATIVE 04/03/2010 1940   BILIRUBINUR NEGATIVE 04/03/2010 1940   KETONESUR NEGATIVE 04/03/2010 1940   PROTEINUR NEGATIVE 04/03/2010 1940   UROBILINOGEN 0.2 04/03/2010 1940   NITRITE NEGATIVE 04/03/2010 1940   LEUKOCYTESUR NEGATIVE MICROSCOPIC NOT DONE ON URINES WITH NEGATIVE PROTEIN, BLOOD, LEUKOCYTES, NITRITE, OR GLUCOSE <1000 mg/dL. 04/03/2010 1940    STUDIES: Mammogram 01/09/2012 was unremarkable bone density 01/16/2011 showed osteopenia  ASSESSMENT: 66 y.o. [place] woman, status post right lumpectomy and sentinel lymph node dissection in February 2011 for a T2 N0, stage IIA  invasive ductal carcinoma, grade 3, ER negative, PR positive at 18%, and HER-2/neu negative.   (1) Status post 6 doses of docetaxel/ doxorubicin/ cyclophosphamide, completed June of 2011   (2) Radiation was completed in September 2011  (3) started on letrozole September 2011  (4) osteopenia, on fosamax   PLAN: She is generally doing quite well on letrozole. I suggested she stop it for 2 months to see if the telogen effluvium problem is improved. We'll so discuss the possibility of switching to tamoxifen. This might allow her to get off the Fosamax. At this point she prefers to stay with what she is familiar with, namely letrozole, and so the plan for now is to continue both letrozole and alendronate. She will see [place] again in 6 months. She knows to call for any problems that may develop before then    Marketta Valadez C    02/13/2012

## 2012-02-13 NOTE — Telephone Encounter (Signed)
gve the pt her jan 2014 appt calendar °

## 2012-03-13 ENCOUNTER — Other Ambulatory Visit: Payer: Self-pay | Admitting: Physician Assistant

## 2012-03-13 DIAGNOSIS — C50911 Malignant neoplasm of unspecified site of right female breast: Secondary | ICD-10-CM

## 2012-04-04 ENCOUNTER — Other Ambulatory Visit: Payer: Self-pay | Admitting: *Deleted

## 2012-04-04 NOTE — Telephone Encounter (Signed)
This RN spoke with pt per her call to triage requesting a prescription for tamoxifen.  Pt states she stopped taking letrozole Aug 1 of this year post md visit and concerns with ongoing hair loss. She states improvement and Zamirah would like to not resume letrozole. " he told me I would need to be off for 2 months and then I could go on Tamoxifen "  Per discussion this RN informed pt MD is out of the office - above recommendations is noted in last office visit.  This note will be given to MD for review for order for Tamoxifen.

## 2012-04-20 ENCOUNTER — Other Ambulatory Visit: Payer: Self-pay | Admitting: *Deleted

## 2012-04-20 MED ORDER — TAMOXIFEN CITRATE 20 MG PO TABS: 20.0000 mg | ORAL_TABLET | Freq: Every day | ORAL | Status: DC

## 2012-08-16 ENCOUNTER — Telehealth: Payer: Self-pay | Admitting: Oncology

## 2012-08-16 ENCOUNTER — Encounter: Payer: Self-pay | Admitting: Physician Assistant

## 2012-08-16 ENCOUNTER — Other Ambulatory Visit (HOSPITAL_BASED_OUTPATIENT_CLINIC_OR_DEPARTMENT_OTHER): Payer: Medicare Other | Admitting: Lab

## 2012-08-16 ENCOUNTER — Ambulatory Visit (HOSPITAL_BASED_OUTPATIENT_CLINIC_OR_DEPARTMENT_OTHER): Payer: Medicare Other | Admitting: Physician Assistant

## 2012-08-16 VITALS — BP 162/78 | HR 93 | Temp 97.8°F | Resp 20 | Ht 64.5 in | Wt 161.8 lb

## 2012-08-16 DIAGNOSIS — C50911 Malignant neoplasm of unspecified site of right female breast: Secondary | ICD-10-CM

## 2012-08-16 DIAGNOSIS — Z853 Personal history of malignant neoplasm of breast: Secondary | ICD-10-CM

## 2012-08-16 DIAGNOSIS — M899 Disorder of bone, unspecified: Secondary | ICD-10-CM

## 2012-08-16 DIAGNOSIS — M8580 Other specified disorders of bone density and structure, unspecified site: Secondary | ICD-10-CM

## 2012-08-16 DIAGNOSIS — Z171 Estrogen receptor negative status [ER-]: Secondary | ICD-10-CM

## 2012-08-16 DIAGNOSIS — C50919 Malignant neoplasm of unspecified site of unspecified female breast: Secondary | ICD-10-CM

## 2012-08-16 DIAGNOSIS — M949 Disorder of cartilage, unspecified: Secondary | ICD-10-CM

## 2012-08-16 LAB — CBC WITH DIFFERENTIAL/PLATELET
BASO%: 0.7 % (ref 0.0–2.0)
Basophils Absolute: 0 10*3/uL (ref 0.0–0.1)
Eosinophils Absolute: 0.1 10*3/uL (ref 0.0–0.5)
HCT: 35.5 % (ref 34.8–46.6)
HGB: 12 g/dL (ref 11.6–15.9)
LYMPH%: 18.8 % (ref 14.0–49.7)
MONO#: 0.5 10*3/uL (ref 0.1–0.9)
NEUT#: 4 10*3/uL (ref 1.5–6.5)
NEUT%: 70.5 % (ref 38.4–76.8)
Platelets: 163 10*3/uL (ref 145–400)
WBC: 5.7 10*3/uL (ref 3.9–10.3)
lymph#: 1.1 10*3/uL (ref 0.9–3.3)

## 2012-08-16 LAB — COMPREHENSIVE METABOLIC PANEL (CC13)
AST: 18 U/L (ref 5–34)
Albumin: 3.6 g/dL (ref 3.5–5.0)
BUN: 18 mg/dL (ref 7.0–26.0)
CO2: 30 mEq/L — ABNORMAL HIGH (ref 22–29)
Calcium: 9.5 mg/dL (ref 8.4–10.4)
Chloride: 104 mEq/L (ref 98–107)
Glucose: 105 mg/dl — ABNORMAL HIGH (ref 70–99)
Potassium: 3.6 mEq/L (ref 3.5–5.1)

## 2012-08-16 NOTE — Progress Notes (Signed)
ID: Deborah Rose   DOB: 1945/10/29  MR#: [id number]  CSN#:[id number]    PCP: Deborah Parisian, MD GYN: Deborah Lora, MD SUHarriette Rose. MD OTHER:  Deborah Blackbird, MD   HISTORY OF PRESENT ILLNESS: Deborah Rose palpated a mass in her right breast and brought it to Dr. Rica Rose attention in early 2011. The patient had had screening mammography in May of 2010, which had been unremarkable. This time, however, on 08/14/2009, diagnostic mammography showed a developing density in the lateral aspect of the right breast. The patient's breasts are dense, which of course limits sensitivity. There were no calcifications of a malignant nature noted, and the left breast was unremarkable. Dr. Judyann Rose was able to indeed palpate a discrete mass in the right breast, 3 cm from the nipple at the 9 o'clock position. Ultrasound the same day showed a hypoechoic irregular mass measuring 1.6 cm. The right axilla showed no enlarged lymph nodes.  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 The prognostic profile showed the tumor to be negative for estrogen and progesterone receptors (both zero percent). The proliferation fraction was very high at 75%, HER-2 was not amplified with a ratio by CISH of 1.06.  Bilateral breast MRIs were obtained on 08/21/2009. The MRI showed an irregular enhancing mass in the posterolateral right breast involving both the upper outer and the lower outer quadrants. It measured up to 5 cm craniocaudally. The posterior aspect of the mass was seen to about the pectoralis muscle. However, no definite involvement of the pectoralis with tumor was noted. There was no suspicious enhancement on the left side, no skin thickening, and no suspicious axillary or internal mammary lymph nodes on either side.  On 09/05/2009, the patient underwent right partial mastectomy with sentinel lymph node mapping. The final  pathology from this procedure ([id number]) showed a 3.8-cm invasive ductal carcinoma involving the posterior margin. There was no lymphovascular invasion, the tumor was read as grade 3, and favorably all 3 sentinel lymph nodes were negative. The estrogen and progesterone receptors were repeated and the estrogen receptor was again negative. However, the progesterone receptor was positive at 18%. HER-2 remained negative with a ratio of 1.21.  The patient was referred to Dr. Pierce Rose for discussion of treatment options, and he suggested the patient consider B-46. The patient was given the material to review, and her decision regarding that is discussed below. As far as the margin is concerned, she underwent an attempt at margin clearance on 09/23/2009, at the same time that she had her port placed. Unfortunately, the pathology from that procedure ([id number]) showed invasive ductal carcinoma still  present at the new surgical resection margin along a broad front. 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  Deborah Rose received 6 cycles of  Docetaxel /doxorubicin /cyclophosphamide, given on a Q. three-week basis, and completed in June of 2011. It was decided that further resection would not be helpful. Her subsequent history is as detailed below  INTERVAL HISTORY: Deborah Rose returns for routine followup of her right breast cancer. Interval history is remarkable for patient having discontinued letrozole and Fosamax in July 2013. She started on tamoxifen, 20 mg daily, in September 2013 and is tolerating that medication very well. One of her biggest complaints with the letrozole was hair loss, and that has resolved.   Otherwise, Deborah Rose is doing very well. 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  She  had a great holiday and she had "everybody under one roof" which she loved.  REVIEW OF SYSTEMS: Deborah Rose denies any recent illnesses and has had no fevers or chills. She's had no  significant hot flashes. She denies any peripheral swelling or evidence of abnormal clotting. No change in vision. No vaginal bleeding, dryness, or discharge has been noted.  Deborah Rose has a good appetite with no nausea or change in bowel or bladder habits. She denies any cough, phlegm production, shortness of breath, or chest pain. She's had no abnormal headaches or dizziness. She denies unusual myalgias, arthralgias, or bony pain.  A detailed review of systems is otherwise stable and noncontributory.   PAST MEDICAL HISTORY: Past Medical History  Diagnosis Date  . Breast cancer, right breast 07/25/2011  1) Hypertension. 2) Status post hysterectomy and bilateral salpingo-oophorectomy in another city in 2000 for fibroids. 3) History of appendectomy. 4) History of "bladder uptuck".    PAST SURGICAL HISTORY: No past surgical history on file.  FAMILY HISTORY No family history on file. The patient's father died from asbestosis  at the age of 28, and was a former Financial controller. The patient's mother is alive in her 58's. The patient has 2 sisters and 1 brother. There is no family history of breast or ovarian cancer to her knowledge  GYNECOLOGIC HISTORY: She is GX, P2. First pregnancy to term age 32. The patient never took hormone replacement.  SOCIAL HISTORY: Deborah Rose used to work in [employer]. Her husband, Deborah Rose, now retired, used to work for [employer] as Occupational psychologist. The daughter, Deborah Rose, is a housewife and substitute Runner, broadcasting/film/video. Their daughter, Deborah Rose, works for [employer]. Both daughters live in [place]. The patient has grandchildren and attends a [employer].    ADVANCED DIRECTIVES:  HEALTH MAINTENANCE: History  Substance Use Topics  . Smoking status: Never Smoker   . Smokeless tobacco: Never Used  . Alcohol Use: 0.6 oz/week    1 Glasses of wine per week     Colonoscopy:  PAP:  Bone density:  Lipid panel:  No Known Allergies  Current  Outpatient Prescriptions  Medication Sig Dispense Refill  . acetaminophen (TYLENOL) 500 MG tablet Take 1,000 mg by mouth every 6 (six) hours as needed.        Marland Kitchen aspirin 81 MG tablet Take 81 mg by mouth daily.        . calcium-vitamin D (OSCAL WITH D) 500-200 MG-UNIT per tablet Take 1 tablet by mouth daily.        Marland Kitchen losartan-hydrochlorothiazide (HYZAAR) 100-12.5 MG per tablet       . metoprolol succinate (TOPROL-XL) 25 MG 24 hr tablet Take 25 mg by mouth daily.        . Multiple Vitamin (MULTIVITAMIN) tablet Take 1 tablet by mouth daily.        . tamoxifen (NOLVADEX) 20 MG tablet Take 1 tablet (20 mg total) by mouth daily.  30 tablet  6    OBJECTIVE: Middle-aged white woman in no acute distress Filed Vitals:   08/16/12 1025  BP: 162/78  Pulse: 93  Temp: 97.8 F (36.6 C)  Resp: 20     Body mass index is 27.34 kg/(m^2).    ECOG FS: 0  Sclerae unicteric Oropharynx clear No cervical or supraclavicular adenopathy Lungs no rales or rhonchi Heart regular rate and rhythm Abdomen soft, nontender, with positive bowel sounds MSK no focal spinal tenderness, no peripheral edema Neuro: nonfocal, well oriented with positive affect Breasts: The  right breast is status post lumpectomy. There is no evidence of local recurrence. Left breast is unremarkable. Axillae are benign bilaterally with no adenopathy.  LAB RESULTS: Lab Results  Component Value Date   WBC 5.7 08/16/2012   NEUTROABS 4.0 08/16/2012   HGB 12.0 08/16/2012   HCT 35.5 08/16/2012   MCV 89.3 08/16/2012   PLT 163 08/16/2012      Chemistry      Component Value Date/Time   NA 142 08/16/2012 1014   NA 143 02/06/2012 0953   K 3.6 08/16/2012 1014   K 3.7 02/06/2012 0953   CL 104 08/16/2012 1014   CL 103 02/06/2012 0953   CO2 30* 08/16/2012 1014   CO2 30 02/06/2012 0953   BUN 18.0 08/16/2012 1014   BUN 17 02/06/2012 0953   CREATININE 0.8 08/16/2012 1014   CREATININE 0.77 02/06/2012 0953      Component Value Date/Time   CALCIUM 9.5  08/16/2012 1014   CALCIUM 9.8 02/06/2012 0953   ALKPHOS 48 08/16/2012 1014   ALKPHOS 63 02/06/2012 0953   AST 18 08/16/2012 1014   AST 20 02/06/2012 0953   ALT 15 08/16/2012 1014   ALT 14 02/06/2012 0953   BILITOT 0.45 08/16/2012 1014   BILITOT 0.5 02/06/2012 0953       Lab Results  Component Value Date   LABCA2 26 02/06/2012     STUDIES: Mammogram 01/09/2012 was unremarkable.  Bone density 04/18/2011 showed osteopenia.    ASSESSMENT: 68 y.o. [place] woman, status post right lumpectomy and sentinel lymph node dissection in February 2011 for a T2 N0, stage IIA  invasive ductal carcinoma, grade 3, ER negative, PR positive at 18%, and HER-2/neu negative.   (1) Status post 6 doses of docetaxel/ doxorubicin/ cyclophosphamide, completed June of 2011   (2) Radiation was completed in September 2011  (3) started on letrozole September 2011 and continued until July 2013. Discontinued due to poor tolerance.  (4)  on tamoxifen since September 2013  (5) history of osteopenia, on fosamax until July 2013   PLAN:  Breane is tolerating tamoxifen very well and will continue with her current regimen. She is due for her next annual mammogram in June, after which she'll see Dr. Darnelle Catalan in July for routine six-month followup. She is due for her next bone density in September 2014 for her history of osteopenia.  She voices understanding and agreement with this plan, and will call if she has any changes or problems.   Shayden Gingrich    08/16/2012

## 2012-08-16 NOTE — Telephone Encounter (Signed)
gv pt appt schedule for July and appts for mammo @ [facility] on June and bone density @ the [facility] in September.

## 2012-08-17 LAB — VITAMIN D 25 HYDROXY (VIT D DEFICIENCY, FRACTURES): Vit D, 25-Hydroxy: 50 ng/mL (ref 30–89)

## 2012-11-19 ENCOUNTER — Other Ambulatory Visit: Payer: Self-pay | Admitting: Oncology

## 2012-11-19 DIAGNOSIS — C50911 Malignant neoplasm of unspecified site of right female breast: Secondary | ICD-10-CM

## 2013-01-08 ENCOUNTER — Ambulatory Visit
Admission: RE | Admit: 2013-01-08 | Discharge: 2013-01-08 | Disposition: A | Discharge status: Home / Self Care | Payer: Medicare Other | Source: Ambulatory Visit | Attending: Physician Assistant | Admitting: Physician Assistant

## 2013-01-08 DIAGNOSIS — Z853 Personal history of malignant neoplasm of breast: Secondary | ICD-10-CM

## 2013-02-12 ENCOUNTER — Other Ambulatory Visit (HOSPITAL_BASED_OUTPATIENT_CLINIC_OR_DEPARTMENT_OTHER): Payer: Medicare Other | Admitting: Lab

## 2013-02-12 DIAGNOSIS — C50911 Malignant neoplasm of unspecified site of right female breast: Secondary | ICD-10-CM

## 2013-02-12 DIAGNOSIS — C50919 Malignant neoplasm of unspecified site of unspecified female breast: Secondary | ICD-10-CM

## 2013-02-12 LAB — CBC WITH DIFFERENTIAL/PLATELET
Basophils Absolute: 0 10*3/uL (ref 0.0–0.1)
Eosinophils Absolute: 0.2 10*3/uL (ref 0.0–0.5)
HCT: 35.9 % (ref 34.8–46.6)
HGB: 12 g/dL (ref 11.6–15.9)
LYMPH%: 27.1 % (ref 14.0–49.7)
MCV: 89.3 fL (ref 79.5–101.0)
MONO#: 0.5 10*3/uL (ref 0.1–0.9)
MONO%: 10 % (ref 0.0–14.0)
NEUT#: 2.9 10*3/uL (ref 1.5–6.5)
NEUT%: 58.5 % (ref 38.4–76.8)
Platelets: 170 10*3/uL (ref 145–400)
WBC: 5 10*3/uL (ref 3.9–10.3)

## 2013-02-12 LAB — COMPREHENSIVE METABOLIC PANEL (CC13)
Alkaline Phosphatase: 45 U/L (ref 40–150)
BUN: 16 mg/dL (ref 7.0–26.0)
CO2: 29 mEq/L (ref 22–29)
Creatinine: 0.8 mg/dL (ref 0.6–1.1)
Glucose: 91 mg/dl (ref 70–140)
Sodium: 143 mEq/L (ref 136–145)
Total Bilirubin: 0.41 mg/dL (ref 0.20–1.20)
Total Protein: 6.5 g/dL (ref 6.4–8.3)

## 2013-02-19 ENCOUNTER — Telehealth: Payer: Self-pay | Admitting: Oncology

## 2013-02-19 ENCOUNTER — Ambulatory Visit (HOSPITAL_BASED_OUTPATIENT_CLINIC_OR_DEPARTMENT_OTHER): Payer: Medicare Other | Admitting: Oncology

## 2013-02-19 VITALS — BP 137/77 | HR 112 | Temp 99.7°F | Resp 20 | Ht 64.5 in | Wt 160.3 lb

## 2013-02-19 DIAGNOSIS — C50911 Malignant neoplasm of unspecified site of right female breast: Secondary | ICD-10-CM

## 2013-02-19 DIAGNOSIS — C50919 Malignant neoplasm of unspecified site of unspecified female breast: Secondary | ICD-10-CM

## 2013-02-19 MED ORDER — TAMOXIFEN CITRATE 20 MG PO TABS: 20.0000 mg | ORAL_TABLET | Freq: Every day | ORAL | Status: DC

## 2013-02-19 NOTE — Telephone Encounter (Signed)
, °

## 2013-02-19 NOTE — Progress Notes (Signed)
ID: [name]   DOB: 08-11-1945  MR#: [id number]  CSN#:[id number]    PCP: Deborah Parisian, MD GYN: Deborah Lora, MD SUHarriette Rose. MD OTHER:  Deborah Blackbird, MD   HISTORY OF PRESENT ILLNESS: Deborah Rose palpated a mass in her right breast and brought it to Deborah Rose attention in early 2011. The patient had had screening mammography in May of 2010, which had been unremarkable. This time, however, on 08/14/2009, diagnostic mammography showed a developing density in the lateral aspect of the right breast. The patient's breasts are dense, which of course limits sensitivity. There were no calcifications of a malignant nature noted, and the left breast was unremarkable. Dr. Judyann Rose was able to indeed palpate a discrete mass in the right breast, 3 cm from the nipple at the 9 o'clock position. Ultrasound the same day showed a hypoechoic irregular mass measuring 1.6 cm. The right axilla showed no enlarged lymph nodes.  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 The prognostic profile showed the tumor to be negative for estrogen and progesterone receptors (both zero percent). The proliferation fraction was very high at 75%, HER-2 was not amplified with a ratio by CISH of 1.06.  Bilateral breast MRIs were obtained on 08/21/2009. The MRI showed an irregular enhancing mass in the posterolateral right breast involving both the upper outer and the lower outer quadrants. It measured up to 5 cm craniocaudally. The posterior aspect of the mass was seen to about the pectoralis muscle. However, no definite involvement of the pectoralis with tumor was noted. There was no suspicious enhancement on the left side, no skin thickening, and no suspicious axillary or internal mammary lymph nodes on either side.  On 09/05/2009, the patient underwent right partial mastectomy with sentinel lymph node mapping. The final  pathology from this procedure ([id number]) showed a 3.8-cm invasive ductal carcinoma involving the posterior margin. There was no lymphovascular invasion, the tumor was read as grade 3, and favorably all 3 sentinel lymph nodes were negative. The estrogen and progesterone receptors were repeated and the estrogen receptor was again negative. However, the progesterone receptor was positive at 18%. HER-2 remained negative with a ratio of 1.21.  The patient was referred to Dr. Pierce Rose for discussion of treatment options, and he suggested the patient consider B-46. The patient was given the material to review, and her decision regarding that is discussed below. As far as the margin is concerned, she underwent an attempt at margin clearance on 09/23/2009, at the same time that she had her port placed. Unfortunately, the pathology from that procedure ([id number]) showed invasive ductal carcinoma still  present at the new surgical resection margin along a broad front. 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  Deborah Rose received 6 cycles of  Docetaxel /doxorubicin /cyclophosphamide, given on a Q. three-week basis, and completed in June of 2011. It was decided that further resection would not be helpful. Her subsequent history is as detailed below  INTERVAL HISTORY: Deborah Rose returns for followup of her breast cancer. The interval history is generally unremarkable. 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 She has a pedometer and many days it tells her that she has walked 10,000 steps.  REVIEW OF SYSTEMS: Deborah Rose is tolerating tamoxifen well. The only symptom she has noted is cramping in her feet and cats. This  tends to happen at night. She does feel she drinks a lot, so she does not believe dehydration is a call us. Otherwise a detailed review of systems today was noncontributory.   PAST MEDICAL  HISTORY: Past Medical History  Diagnosis Date  . Breast cancer, right breast 07/25/2011  1) Hypertension. 2) Status post hysterectomy and bilateral salpingo-oophorectomy in another city in 2000 for fibroids. 3) History of appendectomy. 4) History of "bladder uptuck".    PAST SURGICAL HISTORY: No past surgical history on file.  FAMILY HISTORY No family history on file. The patient's father died from asbestosis  at the age of 10, and was a former Financial controller. The patient's mother is alive in her 72's. The patient has 2 sisters and 1 brother. There is no family history of breast or ovarian cancer to her knowledge  GYNECOLOGIC HISTORY: She is GX, P2. First pregnancy to term age 29. The patient never took hormone replacement.  SOCIAL HISTORY: Deborah Rose used to work in [employer]. Her husband, Deborah Rose, now retired, used to work for [employer] as Occupational psychologist. The daughter, Deborah Rose, is a housewife and substitute Runner, broadcasting/film/video. Their daughter, Deborah Rose, works for [employer]. Both daughters live in [place]. The patient has grandchildren and attends a [employer].    ADVANCED DIRECTIVES:  HEALTH MAINTENANCE: History  Substance Use Topics  . Smoking status: Never Smoker   . Smokeless tobacco: Never Used  . Alcohol Use: 0.6 oz/week    1 Glasses of wine per week     Colonoscopy:  PAP:  Bone density:  Lipid panel:  No Known Allergies  Current Outpatient Prescriptions  Medication Sig Dispense Refill  . acetaminophen (TYLENOL) 500 MG tablet Take 1,000 mg by mouth every 6 (six) hours as needed.        Marland Kitchen aspirin 81 MG tablet Take 81 mg by mouth daily.        . calcium-vitamin D (OSCAL WITH D) 500-200 MG-UNIT per tablet Take 1 tablet by mouth daily.        Marland Kitchen losartan-hydrochlorothiazide (HYZAAR) 100-12.5 MG per tablet       . metoprolol succinate (TOPROL-XL) 25 MG 24 hr tablet Take 25 mg by mouth daily.        . Multiple Vitamin (MULTIVITAMIN) tablet Take 1  tablet by mouth daily.        . tamoxifen (NOLVADEX) 20 MG tablet TAKE ONE TABLET BY MOUTH EVERY DAY  30 tablet  3   No current facility-administered medications for this visit.    OBJECTIVE: Middle-aged white woman who appears well Filed Vitals:   02/19/13 0929  BP: 137/77  Pulse: 112  Temp: 99.7 F (37.6 C)  Resp: 20     Body mass index is 27.1 kg/(m^2).    ECOG FS: 0  Sclerae unicteric, pupils equal round and reactive to light Oropharynx clear No cervical or supraclavicular adenopathy Lungs no rales or rhonchi Heart regular rate and rhythm, no murmur appreciated Abdomen soft, nontender, with positive bowel sounds MSK no focal spinal tenderness, no peripheral edema Neuro: nonfocal, well oriented, positive affect Breasts: The right breast is status post lumpectomy. There is no evidence of local recurrence. The right axilla is benign. Left breast is unremarkable.   LAB RESULTS: Lab Results  Component Value Date   WBC 5.0 02/12/2013   NEUTROABS 2.9 02/12/2013   HGB 12.0 02/12/2013   HCT 35.9 02/12/2013   MCV 89.3 02/12/2013   PLT 170 02/12/2013  Chemistry      Component Value Date/Time   NA 143 02/12/2013 1015   NA 143 02/06/2012 0953   K 3.9 02/12/2013 1015   K 3.7 02/06/2012 0953   CL 104 08/16/2012 1014   CL 103 02/06/2012 0953   CO2 29 02/12/2013 1015   CO2 30 02/06/2012 0953   BUN 16.0 02/12/2013 1015   BUN 17 02/06/2012 0953   CREATININE 0.8 02/12/2013 1015   CREATININE 0.77 02/06/2012 0953      Component Value Date/Time   CALCIUM 9.5 02/12/2013 1015   CALCIUM 9.8 02/06/2012 0953   ALKPHOS 45 02/12/2013 1015   ALKPHOS 63 02/06/2012 0953   AST 18 02/12/2013 1015   AST 20 02/06/2012 0953   ALT 13 02/12/2013 1015   ALT 14 02/06/2012 0953   BILITOT 0.41 02/12/2013 1015   BILITOT 0.5 02/06/2012 0953       Lab Results  Component Value Date   LABCA2 26 08/16/2012     STUDIES: Mammogram June 2014 was unremarkable.  Bone density is due September 2014  ASSESSMENT: 67  y.o. [place] woman, status post right lumpectomy and sentinel lymph node dissection in February 2011 for a T2 N0, stage IIA  invasive ductal carcinoma, grade 3, ER negative, PR positive at 18%, and HER-2/neu negative.   (1) Status post 6 doses of docetaxel/ doxorubicin/ cyclophosphamide, completed June of 2011   (2) Radiation was completed in September 2011  (3) started on letrozole September 2011 and continued until July 2013. Discontinued due to poor tolerance.  (4)  on tamoxifen since September 2013  (5) history of osteopenia, on fosamax until July 2013   PLAN:  Jany is doing fine as far as breast cancer is concerned. She had some questions regarding the safety of the shingles vaccine in her situation, and I think that would be fine. It is rather pricey so she will think about it. We also discussed genetic testing, and she does not meet criteria, her risk of having a BRCA mutation being too low.  The plan is to continue tamoxifen until September of 2016. She will see [place] again in July of 2015 and then one final visit July of 2016. I am hopeful her bone density this year will show at least stable osteopenia if not improvement. She knows to call for any problems that may develop before the next visit.   MAGRINAT,GUSTAV C    02/19/2013

## 2013-02-27 ENCOUNTER — Other Ambulatory Visit: Payer: Self-pay

## 2013-04-18 ENCOUNTER — Ambulatory Visit
Admission: RE | Admit: 2013-04-18 | Discharge: 2013-04-18 | Disposition: A | Discharge status: Home / Self Care | Payer: Medicare Other | Source: Ambulatory Visit | Attending: Physician Assistant | Admitting: Physician Assistant

## 2013-04-18 DIAGNOSIS — M8580 Other specified disorders of bone density and structure, unspecified site: Secondary | ICD-10-CM

## 2013-05-30 ENCOUNTER — Other Ambulatory Visit: Payer: Self-pay

## 2014-01-02 ENCOUNTER — Other Ambulatory Visit: Payer: Self-pay | Admitting: Physician Assistant

## 2014-01-02 DIAGNOSIS — Z853 Personal history of malignant neoplasm of breast: Secondary | ICD-10-CM

## 2014-01-02 DIAGNOSIS — Z9889 Other specified postprocedural states: Secondary | ICD-10-CM

## 2014-01-10 ENCOUNTER — Ambulatory Visit
Admission: RE | Admit: 2014-01-10 | Discharge: 2014-01-10 | Disposition: A | Discharge status: Home / Self Care | Payer: Medicare Other | Source: Ambulatory Visit | Attending: Physician Assistant | Admitting: Physician Assistant

## 2014-01-10 DIAGNOSIS — Z853 Personal history of malignant neoplasm of breast: Secondary | ICD-10-CM

## 2014-01-10 DIAGNOSIS — Z9889 Other specified postprocedural states: Secondary | ICD-10-CM

## 2014-02-10 ENCOUNTER — Other Ambulatory Visit: Payer: Self-pay | Admitting: *Deleted

## 2014-02-10 DIAGNOSIS — C50911 Malignant neoplasm of unspecified site of right female breast: Secondary | ICD-10-CM

## 2014-02-10 DIAGNOSIS — M8580 Other specified disorders of bone density and structure, unspecified site: Secondary | ICD-10-CM

## 2014-02-11 ENCOUNTER — Other Ambulatory Visit (HOSPITAL_BASED_OUTPATIENT_CLINIC_OR_DEPARTMENT_OTHER): Payer: Medicare Other

## 2014-02-11 DIAGNOSIS — C50919 Malignant neoplasm of unspecified site of unspecified female breast: Secondary | ICD-10-CM

## 2014-02-11 DIAGNOSIS — M8580 Other specified disorders of bone density and structure, unspecified site: Secondary | ICD-10-CM

## 2014-02-11 DIAGNOSIS — C50911 Malignant neoplasm of unspecified site of right female breast: Secondary | ICD-10-CM

## 2014-02-11 LAB — COMPREHENSIVE METABOLIC PANEL (CC13)
ALK PHOS: 42 U/L (ref 40–150)
ALT: 12 U/L (ref 0–55)
AST: 19 U/L (ref 5–34)
Albumin: 3.7 g/dL (ref 3.5–5.0)
Anion Gap: 8 mEq/L (ref 3–11)
BUN: 14.8 mg/dL (ref 7.0–26.0)
CALCIUM: 9.4 mg/dL (ref 8.4–10.4)
CHLORIDE: 107 meq/L (ref 98–109)
CO2: 28 mEq/L (ref 22–29)
CREATININE: 0.9 mg/dL (ref 0.6–1.1)
Glucose: 91 mg/dl (ref 70–140)
Potassium: 3.9 mEq/L (ref 3.5–5.1)
Sodium: 142 mEq/L (ref 136–145)
Total Bilirubin: 0.5 mg/dL (ref 0.20–1.20)
Total Protein: 6.6 g/dL (ref 6.4–8.3)

## 2014-02-11 LAB — CBC WITH DIFFERENTIAL/PLATELET
BASO%: 0.2 % (ref 0.0–2.0)
Basophils Absolute: 0 10*3/uL (ref 0.0–0.1)
EOS%: 4 % (ref 0.0–7.0)
Eosinophils Absolute: 0.2 10*3/uL (ref 0.0–0.5)
HEMATOCRIT: 38.1 % (ref 34.8–46.6)
HGB: 12.1 g/dL (ref 11.6–15.9)
LYMPH#: 1.4 10*3/uL (ref 0.9–3.3)
LYMPH%: 29 % (ref 14.0–49.7)
MCH: 28.6 pg (ref 25.1–34.0)
MCHC: 31.8 g/dL (ref 31.5–36.0)
MCV: 90.1 fL (ref 79.5–101.0)
MONO#: 0.4 10*3/uL (ref 0.1–0.9)
MONO%: 8.2 % (ref 0.0–14.0)
NEUT#: 2.8 10*3/uL (ref 1.5–6.5)
NEUT%: 58.6 % (ref 38.4–76.8)
Platelets: 167 10*3/uL (ref 145–400)
RBC: 4.23 10*6/uL (ref 3.70–5.45)
RDW: 13.7 % (ref 11.2–14.5)
WBC: 4.8 10*3/uL (ref 3.9–10.3)

## 2014-02-18 ENCOUNTER — Telehealth: Payer: Self-pay | Admitting: Nurse Practitioner

## 2014-02-18 ENCOUNTER — Ambulatory Visit (HOSPITAL_BASED_OUTPATIENT_CLINIC_OR_DEPARTMENT_OTHER): Payer: Medicare Other | Admitting: Nurse Practitioner

## 2014-02-18 VITALS — BP 148/85 | HR 103 | Temp 98.4°F | Resp 18 | Ht 64.5 in | Wt 155.9 lb

## 2014-02-18 DIAGNOSIS — C50911 Malignant neoplasm of unspecified site of right female breast: Secondary | ICD-10-CM

## 2014-02-18 DIAGNOSIS — M899 Disorder of bone, unspecified: Secondary | ICD-10-CM

## 2014-02-18 DIAGNOSIS — M8580 Other specified disorders of bone density and structure, unspecified site: Secondary | ICD-10-CM

## 2014-02-18 DIAGNOSIS — Z853 Personal history of malignant neoplasm of breast: Secondary | ICD-10-CM

## 2014-02-18 DIAGNOSIS — Z79810 Long term (current) use of selective estrogen receptor modulators (SERMs): Secondary | ICD-10-CM

## 2014-02-18 DIAGNOSIS — M949 Disorder of cartilage, unspecified: Secondary | ICD-10-CM

## 2014-02-18 NOTE — Telephone Encounter (Signed)
per pof to sch pt appt-gave pt copy of sch °

## 2014-02-18 NOTE — Progress Notes (Addendum)
ID: Deborah Rose   DOB: 02-27-1946  MR#: [id number]  CSN#:[id number]    PCP: Lynne Logan, MD GYN: Rolly Pancake, MD [name]. MD OTHER:  [name], MD   BREAST CANCER HISTORY: Bryer palpated a mass in her right breast and brought it to Dr. [name] attention in early 2011. The patient had had screening mammography in May of 2010, which had been unremarkable. This time, however, on 08/14/2009, diagnostic mammography showed a developing density in the lateral aspect of the right breast. The patient's breasts are dense, which of course limits sensitivity. There were no calcifications of a malignant nature noted, and the left breast was unremarkable. Dr. [name] was able to indeed palpate a discrete mass in the right breast, 3 cm from the nipple at the 9 o'clock position. Ultrasound the same day showed a hypoechoic irregular mass measuring 1.6 cm. The right axilla showed no enlarged lymph nodes.  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 The prognostic profile showed the tumor to be negative for estrogen and progesterone receptors (both zero percent). The proliferation fraction was very high at 75%, HER-2 was not amplified with a ratio by CISH of 1.06.  Bilateral breast MRIs were obtained on 08/21/2009. The MRI showed an irregular enhancing mass in the posterolateral right breast involving both the upper outer and the lower outer quadrants. It measured up to 5 cm craniocaudally. The posterior aspect of the mass was seen to about the pectoralis muscle. However, no definite involvement of the pectoralis with tumor was noted. There was no suspicious enhancement on the left side, no skin thickening, and no suspicious axillary or internal mammary lymph nodes on either side.  On 09/05/2009, the patient underwent right partial mastectomy with sentinel lymph node mapping. The final  pathology from this procedure ([id number]) showed a 3.8-cm invasive ductal carcinoma involving the posterior margin. There was no lymphovascular invasion, the tumor was read as grade 3, and favorably all 3 sentinel lymph nodes were negative. The estrogen and progesterone receptors were repeated and the estrogen receptor was again negative. However, the progesterone receptor was positive at 18%. HER-2 remained negative with a ratio of 1.21.  The patient was referred to Dr. [name] for discussion of treatment options, and he suggested the patient consider B-46. The patient was given the material to review, and her decision regarding that is discussed below. As far as the margin is concerned, she underwent an attempt at margin clearance on 09/23/2009, at the same time that she had her port placed. Unfortunately, the pathology from that procedure ([id number]) showed invasive ductal carcinoma still  present at the new surgical resection margin along a broad front. 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  Aylana received 6 cycles of  Docetaxel /doxorubicin /cyclophosphamide, given on a Q. three-week basis, and completed in June of 2011. It was decided that further resection would not be helpful. Her subsequent history is as detailed below  INTERVAL HISTORY: [name] returns for follow up for her right breast cancer. Her interval history is unremarkable. She continues to be active and walks daily, which she enjoys. She has invested in a digital wrist pedometer which reports between 7,000-10,000 steps per day.   REVIEW OF SYSTEMS: She is tolerating tamoxifen well. She complains of occasional night sweats, but nothing she has not controlled with a fan and cool cloths. She also has vaginal wetness, but she  states that is under control with panty liners. Otherwise a detailed review of systems today was negative.    PAST MEDICAL HISTORY: Past Medical History  Diagnosis Date  . Breast  cancer, right breast 07/25/2011  1) Hypertension. 2) Status post hysterectomy and bilateral salpingo-oophorectomy in another city in 2000 for fibroids. 3) History of appendectomy. 4) History of "bladder uptuck".    PAST SURGICAL HISTORY: No past surgical history on file. Right breast lumpectomy and sentinel lymph node dissection in February 2001  FAMILY HISTORY No family history on file. The patient's father died from asbestosis  at the age of 30, and was a former Veterinary surgeon. The patient's mother is alive in her 4's. The patient has 2 sisters and 1 brother. There is no family history of breast or ovarian cancer to her knowledge  GYNECOLOGIC HISTORY: She is GX, P2. First pregnancy to term age 81. The patient never took hormone replacement.  SOCIAL HISTORY: Deborah Rose used to work in [employer]. Her husband, Deborah Rose, now retired, used to work for [employer] as Land. The daughter, Deborah Rose, is a housewife and substitute Pharmacist, hospital. Their daughter, Deborah Rose, works for [employer]. Both daughters live in [place]. The patient has grandchildren and attends a [employer].    ADVANCED DIRECTIVES:  HEALTH MAINTENANCE: History  Substance Use Topics  . Smoking status: Never Smoker   . Smokeless tobacco: Never Used  . Alcohol Use: 0.6 oz/week    1 Glasses of wine per week     Colonoscopy: patient can't remember, but "up to date"  PAP: no longer receiving  Bone density: 04/18/13 at [facility], T-score -1.8 (osteopenia)  Lipid panel:  No Known Allergies  Current Outpatient Prescriptions  Medication Sig Dispense Refill  . aspirin 81 MG tablet Take 81 mg by mouth daily.        . calcium-vitamin D (OSCAL WITH D) 500-200 MG-UNIT per tablet Take 1 tablet by mouth daily.        Marland Kitchen losartan-hydrochlorothiazide (HYZAAR) 100-12.5 MG per tablet       . metoprolol succinate (TOPROL-XL) 25 MG 24 hr tablet Take 25 mg by mouth daily.        . Multiple  Vitamin (MULTIVITAMIN) tablet Take 1 tablet by mouth daily.        . tamoxifen (NOLVADEX) 20 MG tablet Take 1 tablet (20 mg total) by mouth daily.  90 tablet  12  . acetaminophen (TYLENOL) 500 MG tablet Take 1,000 mg by mouth every 6 (six) hours as needed.         No current facility-administered medications for this visit.    OBJECTIVE: Middle-aged white woman who appears well Filed Vitals:   02/18/14 0958  BP: 148/85  Pulse: 103  Temp: 98.4 F (36.9 C)  Resp: 18     Body mass index is 26.36 kg/(m^2).    ECOG FS: 0  Skin: warm, dry HEENT: sclerae anicteric, conjunctivae pink, oropharynx clear. No thrush or mucositis.  Lymph Nodes: No cervical or Pinckney lymphadenopathy  Lungs: clear to auscultation bilaterally, no rales, wheezes, or rhonci Heart: regular rate and rhythm Abdomen: round, soft, non tender, positive bowel sounds Musculoskeletal: No focal spinal tenderness, no peripheral edema Neuro: non focal, well oriented, positive affect Breast: Right breast status post lumpectomy. No evidence of local recurrence. Right axilla benign. Left breast unremarkable. No masses, skin changes, or nipple inversion.    LAB RESULTS: Lab Results  Component Value Date   WBC 4.8 02/11/2014  NEUTROABS 2.8 02/11/2014   HGB 12.1 02/11/2014   HCT 38.1 02/11/2014   MCV 90.1 02/11/2014   PLT 167 02/11/2014      Chemistry      Component Value Date/Time   NA 142 02/11/2014 0853   NA 143 02/06/2012 0953   K 3.9 02/11/2014 0853   K 3.7 02/06/2012 0953   CL 104 08/16/2012 1014   CL 103 02/06/2012 0953   CO2 28 02/11/2014 0853   CO2 30 02/06/2012 0953   BUN 14.8 02/11/2014 0853   BUN 17 02/06/2012 0953   CREATININE 0.9 02/11/2014 0853   CREATININE 0.77 02/06/2012 0953      Component Value Date/Time   CALCIUM 9.4 02/11/2014 0853   CALCIUM 9.8 02/06/2012 0953   ALKPHOS 42 02/11/2014 0853   ALKPHOS 63 02/06/2012 0953   AST 19 02/11/2014 0853   AST 20 02/06/2012 0953   ALT 12 02/11/2014 0853   ALT 14 02/06/2012  0953   BILITOT 0.50 02/11/2014 0853   BILITOT 0.5 02/06/2012 0953       Lab Results  Component Value Date   LABCA2 26 08/16/2012     STUDIES: Most recent mammogram on 01/10/14 performed at the breast center was unremarkable   Most recent bone density scan on 04/18/13 showed a t-score of -1.8 (osteopenia).   ASSESSMENT: 68 y.o. [place] woman, status post right lumpectomy and sentinel lymph node dissection in February 2011 for a T2 N0, stage IIA  invasive ductal carcinoma, grade 3, ER negative, PR positive at 18%, and HER-2/neu negative.   (1) Status post 6 doses of docetaxel/ doxorubicin/ cyclophosphamide, completed June of 2011   (2) Radiation was completed in September 2011  (3) started on letrozole September 2011 and continued until July 2013. Discontinued due to poor tolerance.  (4)  on tamoxifen since September 2013  (5) history of osteopenia, on fosamax until July 2013   PLAN:   Ellinor is doing well with regards to her breast cancer. She is tolerating tamoxifen well with minor side effects and is obtaining it at a good price.   Her bone density scan shows slightly worsening osteopenia. We discussed the bone enhancing effect that tamoxifen has and she has been physically active and taking os-cal as advised. The plan is to continue tamoxifen until September 2016 and have a follow up visit in July 2015. At that time she will "graduate" from treatment. She displays understanding and agreement. She has been encouraged to all with any issues that arise before her next visit.    Marcelino Duster    02/18/2014   ADDENDUM: Ashanti is doing fine and will be ready to "graduate" when seh completed five years of anti-estrogen therapy in SEPT 2016.   I personally saw this patient and performed a substantive portion of this encounter with the listed APP documented above.   Chauncey Cruel, MD

## 2014-03-11 ENCOUNTER — Other Ambulatory Visit: Payer: Self-pay | Admitting: Oncology

## 2014-03-11 DIAGNOSIS — C50911 Malignant neoplasm of unspecified site of right female breast: Secondary | ICD-10-CM

## 2014-05-09 ENCOUNTER — Other Ambulatory Visit: Payer: Self-pay

## 2014-05-12 ENCOUNTER — Encounter: Payer: Self-pay | Admitting: *Deleted

## 2014-12-29 ENCOUNTER — Other Ambulatory Visit: Payer: Self-pay | Admitting: Oncology

## 2014-12-29 DIAGNOSIS — Z853 Personal history of malignant neoplasm of breast: Secondary | ICD-10-CM

## 2015-01-12 ENCOUNTER — Ambulatory Visit
Admission: RE | Admit: 2015-01-12 | Discharge: 2015-01-12 | Disposition: A | Discharge status: Home / Self Care | Payer: Medicare Other | Source: Ambulatory Visit | Attending: Oncology | Admitting: Oncology

## 2015-01-12 DIAGNOSIS — Z853 Personal history of malignant neoplasm of breast: Secondary | ICD-10-CM

## 2015-01-19 ENCOUNTER — Other Ambulatory Visit: Payer: Self-pay

## 2015-02-13 ENCOUNTER — Other Ambulatory Visit (HOSPITAL_BASED_OUTPATIENT_CLINIC_OR_DEPARTMENT_OTHER): Payer: Medicare Other

## 2015-02-13 DIAGNOSIS — C50811 Malignant neoplasm of overlapping sites of right female breast: Secondary | ICD-10-CM | POA: Diagnosis present

## 2015-02-13 DIAGNOSIS — C50911 Malignant neoplasm of unspecified site of right female breast: Secondary | ICD-10-CM

## 2015-02-13 LAB — COMPREHENSIVE METABOLIC PANEL (CC13)
ALBUMIN: 3.7 g/dL (ref 3.5–5.0)
ALT: 13 U/L (ref 0–55)
ANION GAP: 8 meq/L (ref 3–11)
AST: 19 U/L (ref 5–34)
Alkaline Phosphatase: 44 U/L (ref 40–150)
BILIRUBIN TOTAL: 0.45 mg/dL (ref 0.20–1.20)
BUN: 14 mg/dL (ref 7.0–26.0)
CHLORIDE: 107 meq/L (ref 98–109)
CO2: 29 meq/L (ref 22–29)
CREATININE: 0.9 mg/dL (ref 0.6–1.1)
Calcium: 9.3 mg/dL (ref 8.4–10.4)
EGFR: 70 mL/min/{1.73_m2} — AB (ref >90)
GLUCOSE: 118 mg/dL (ref 70–140)
POTASSIUM: 3.9 meq/L (ref 3.5–5.1)
Sodium: 143 mEq/L (ref 136–145)
TOTAL PROTEIN: 6.3 g/dL — AB (ref 6.4–8.3)

## 2015-02-13 LAB — CBC WITH DIFFERENTIAL/PLATELET
BASO%: 0.6 % (ref 0.0–2.0)
Basophils Absolute: 0 10*3/uL (ref 0.0–0.1)
EOS ABS: 0.2 10*3/uL (ref 0.0–0.5)
EOS%: 3.1 % (ref 0.0–7.0)
HEMATOCRIT: 37.9 % (ref 34.8–46.6)
HGB: 12.3 g/dL (ref 11.6–15.9)
LYMPH#: 1.6 10*3/uL (ref 0.9–3.3)
LYMPH%: 30.3 % (ref 14.0–49.7)
MCH: 29.7 pg (ref 25.1–34.0)
MCHC: 32.5 g/dL (ref 31.5–36.0)
MCV: 91.5 fL (ref 79.5–101.0)
MONO#: 0.4 10*3/uL (ref 0.1–0.9)
MONO%: 7.9 % (ref 0.0–14.0)
NEUT%: 58.1 % (ref 38.4–76.8)
NEUTROS ABS: 3 10*3/uL (ref 1.5–6.5)
Platelets: 151 10*3/uL (ref 145–400)
RBC: 4.14 10*6/uL (ref 3.70–5.45)
RDW: 13.4 % (ref 11.2–14.5)
WBC: 5.2 10*3/uL (ref 3.9–10.3)

## 2015-02-20 ENCOUNTER — Encounter: Payer: Self-pay | Admitting: Nurse Practitioner

## 2015-02-20 ENCOUNTER — Ambulatory Visit (HOSPITAL_BASED_OUTPATIENT_CLINIC_OR_DEPARTMENT_OTHER): Payer: Medicare Other | Admitting: Nurse Practitioner

## 2015-02-20 VITALS — BP 163/79 | HR 99 | Temp 98.2°F | Resp 18 | Wt 163.2 lb

## 2015-02-20 DIAGNOSIS — C50811 Malignant neoplasm of overlapping sites of right female breast: Secondary | ICD-10-CM

## 2015-02-20 DIAGNOSIS — M8580 Other specified disorders of bone density and structure, unspecified site: Secondary | ICD-10-CM | POA: Diagnosis not present

## 2015-02-20 DIAGNOSIS — C50911 Malignant neoplasm of unspecified site of right female breast: Secondary | ICD-10-CM

## 2015-02-20 NOTE — Progress Notes (Signed)
ID: Deborah Rose   DOB: 07-05-1946  MR#: [id number]  CSN#:[id number]    PCP: Lynne Logan, MD GYN: Rolly Pancake, MD [name]. MD OTHER:  [name], MD   BREAST CANCER HISTORY: Deborah Rose palpated a mass in her right breast and brought it to Dr. [name] attention in early 2011. The patient had had screening mammography in May of 2010, which had been unremarkable. This time, however, on 08/14/2009, diagnostic mammography showed a developing density in the lateral aspect of the right breast. The patient's breasts are dense, which of course limits sensitivity. There were no calcifications of a malignant nature noted, and the left breast was unremarkable. Dr. [name] was able to indeed palpate a discrete mass in the right breast, 3 cm from the nipple at the 9 o'clock position. Ultrasound the same day showed a hypoechoic irregular mass measuring 1.6 cm. The right axilla showed no enlarged lymph nodes.  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 The prognostic profile showed the tumor to be negative for estrogen and progesterone receptors (both zero percent). The proliferation fraction was very high at 75%, HER-2 was not amplified with a ratio by CISH of 1.06.  Bilateral breast MRIs were obtained on 08/21/2009. The MRI showed an irregular enhancing mass in the posterolateral right breast involving both the upper outer and the lower outer quadrants. It measured up to 5 cm craniocaudally. The posterior aspect of the mass was seen to about the pectoralis muscle. However, no definite involvement of the pectoralis with tumor was noted. There was no suspicious enhancement on the left side, no skin thickening, and no suspicious axillary or internal mammary lymph nodes on either side.  On 09/05/2009, the patient underwent right partial mastectomy with sentinel lymph node mapping. The final  pathology from this procedure ([id number]) showed a 3.8-cm invasive ductal carcinoma involving the posterior margin. There was no lymphovascular invasion, the tumor was read as grade 3, and favorably all 3 sentinel lymph nodes were negative. The estrogen and progesterone receptors were repeated and the estrogen receptor was again negative. However, the progesterone receptor was positive at 18%. HER-2 remained negative with a ratio of 1.21.  The patient was referred to Dr. [name] for discussion of treatment options, and he suggested the patient consider B-46. The patient was given the material to review, and her decision regarding that is discussed below. As far as the margin is concerned, she underwent an attempt at margin clearance on 09/23/2009, at the same time that she had her port placed. Unfortunately, the pathology from that procedure ([id number]) showed invasive ductal carcinoma still  present at the new surgical resection margin along a broad front. 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  Deborah Rose received 6 cycles of  Docetaxel /doxorubicin /cyclophosphamide, given on a Q. three-week basis, and completed in June of 2011. It was decided that further resection would not be helpful. Her subsequent history is as detailed below  INTERVAL HISTORY: Deborah Rose returns for follow up for her right breast cancer. She has been on tamoxifen since September 2011 and tolerates this well. She has occasional hot flashes and vaginal changes. The interval history is generally unremarkable. She continues to ambulate regularly for exercise.   REVIEW OF SYSTEMS: Jonelle denies fevers, chills, nausea, vomiting, or changes in bowel or bladder habits. She denies shortness of breath, chest pain, cough, or palpitations. She has no pain, headaches,  dizziness, unexplained weight loss, or fatigue. A detailed review of systems is otherwise stable.  PAST MEDICAL HISTORY: Past Medical History   Diagnosis Date  . Breast cancer, right breast 07/25/2011  1) Hypertension. 2) Status post hysterectomy and bilateral salpingo-oophorectomy in another city in 2000 for fibroids. 3) History of appendectomy. 4) History of "bladder uptuck".    PAST SURGICAL HISTORY: No past surgical history on file. Right breast lumpectomy and sentinel lymph node dissection in February 2001  FAMILY HISTORY No family history on file. The patient's father died from asbestosis  at the age of 66, and was a former Veterinary surgeon. The patient's mother is alive in her 22's. The patient has 2 sisters and 1 brother. There is no family history of breast or ovarian cancer to her knowledge  GYNECOLOGIC HISTORY: She is GX, P2. First pregnancy to term age 33. The patient never took hormone replacement.  SOCIAL HISTORY: Deborah Rose used to work in [employer]. Her husband, Deborah Rose, now retired, used to work for [employer] as Land. The daughter, Deborah Rose, is a housewife and substitute Pharmacist, hospital. Their daughter, Deborah Rose, works for [employer]. Both daughters live in [place]. The patient has grandchildren and attends a [employer].    ADVANCED DIRECTIVES:  HEALTH MAINTENANCE: History  Substance Use Topics  . Smoking status: Never Smoker   . Smokeless tobacco: Never Used  . Alcohol Use: 0.6 oz/week    1 Glasses of wine per week     Colonoscopy: patient can't remember, but "up to date"  PAP: no longer receiving  Bone density: 04/18/13 at [facility], T-score -1.8 (osteopenia)  Lipid panel:  No Known Allergies  Current Outpatient Prescriptions  Medication Sig Dispense Refill  . acetaminophen (TYLENOL) 500 MG tablet Take 1,000 mg by mouth every 6 (six) hours as needed.      . calcium-vitamin D (OSCAL WITH D) 500-200 MG-UNIT per tablet Take 1 tablet by mouth daily.      Marland Kitchen losartan-hydrochlorothiazide (HYZAAR) 50-12.5 MG per tablet Take 1 tablet by mouth daily.     .  metoprolol succinate (TOPROL-XL) 25 MG 24 hr tablet Take 25 mg by mouth daily.      . Multiple Vitamin (MULTIVITAMIN) tablet Take 1 tablet by mouth daily.      . tamoxifen (NOLVADEX) 20 MG tablet TAKE ONE TABLET BY MOUTH ONCE DAILY 90 tablet 4  . aspirin 81 MG tablet Take 81 mg by mouth daily.       No current facility-administered medications for this visit.    OBJECTIVE: Middle-aged white woman who appears well Filed Vitals:   02/20/15 1302  BP: 163/79  Pulse: 99  Temp: 98.2 F (36.8 C)  Resp: 18     Body mass index is 27.59 kg/(m^2).    ECOG FS: 0  Skin: warm, dry HEENT: sclerae anicteric, conjunctivae pink, oropharynx clear. No thrush or mucositis.  Lymph Nodes: No cervical or [place] lymphadenopathy  Lungs: clear to auscultation bilaterally, no rales, wheezes, or rhonci Heart: regular rate and rhythm Abdomen: round, soft, non tender, positive bowel sounds Musculoskeletal: No focal spinal tenderness, no peripheral edema Neuro: non focal, well oriented, positive affect Breast: Right breast status post lumpectomy. No evidence of recurrent disease. Right axilla benign. Left breast unremarkable.   LAB RESULTS: Lab Results  Component Value Date   WBC 5.2 02/13/2015   NEUTROABS 3.0 02/13/2015   HGB 12.3 02/13/2015   HCT 37.9 02/13/2015   MCV 91.5 02/13/2015   PLT  151 02/13/2015      Chemistry      Component Value Date/Time   NA 143 02/13/2015 0958   NA 143 02/06/2012 0953   K 3.9 02/13/2015 0958   K 3.7 02/06/2012 0953   CL 104 08/16/2012 1014   CL 103 02/06/2012 0953   CO2 29 02/13/2015 0958   CO2 30 02/06/2012 0953   BUN 14.0 02/13/2015 0958   BUN 17 02/06/2012 0953   CREATININE 0.9 02/13/2015 0958   CREATININE 0.77 02/06/2012 0953      Component Value Date/Time   CALCIUM 9.3 02/13/2015 0958   CALCIUM 9.8 02/06/2012 0953   ALKPHOS 44 02/13/2015 0958   ALKPHOS 63 02/06/2012 0953   AST 19 02/13/2015 0958   AST 20 02/06/2012 0953   ALT 13 02/13/2015 0958   ALT  14 02/06/2012 0953   BILITOT 0.45 02/13/2015 0958   BILITOT 0.5 02/06/2012 0953       Lab Results  Component Value Date   LABCA2 26 08/16/2012     STUDIES: No results found.  EXAM: DIGITAL DIAGNOSTIC BILATERAL MAMMOGRAM WITH 3D TOMOSYNTHESIS AND CAD  COMPARISON: 01/10/2014 and earlier  ACR Breast Density Category c: The breast tissue is heterogeneously dense, which may obscure small masses.  FINDINGS: Postoperative changes are seen in the right breast. No suspicious mass, distortion, or microcalcifications are identified to suggest presence of malignancy.  Mammographic images were processed with CAD.  IMPRESSION: No mammographic evidence for malignancy.  RECOMMENDATION: Diagnostic mammogram is suggested in 1 year. (Code:DM-B-01Y)  I have discussed the findings and recommendations with the patient. Results were also provided in writing at the conclusion of the visit. If applicable, a reminder letter will be sent to the patient regarding the next appointment.  BI-RADS CATEGORY 2: Benign.   Electronically Signed  By: Nolon Nations M.D.  On: 01/12/2015 11:23  Most recent bone density scan on 04/18/13 showed a t-score of -1.8 (osteopenia).  ASSESSMENT: 69 y.o. [place] woman, status post right lumpectomy and sentinel lymph node dissection in February 2011 for a T2 N0, stage IIA  invasive ductal carcinoma, grade 3, ER negative, PR positive at 18%, and HER-2/neu negative.   (1) Status post 6 doses of docetaxel/ doxorubicin/ cyclophosphamide, completed June of 2011   (2) Radiation was completed in September 2011  (3) started on letrozole September 2011 and continued until July 2013. Discontinued due to poor tolerance.  (4)  on tamoxifen since September 2013  (5) history of osteopenia, on fosamax until July 2013   PLAN:  Dorothyann is doing well as far as her breast cancer is concerned. She is now 5 years out from her definitive surgery with no  evidence of recurrent disease. She has just 2 months left on tamoxifen to complete 5 years of antiestrogen therapy. She has enough pills to cover her during this time, so she will finish out her remaining supply, and then discontinue use of this drug.   Goldie and I discussed "graduating" from follow up visits. At this time she will return to her PCP or GYN for all routine follow up visits. She knows she is due for a repeat mammogram next June. Of course we keep her records for 10 years, and will be available for any questions or concerns at her request. Minsa understands and agrees with this plan. She has been encouraged to call with any issues that might arise. It has been a pleasure caring for this patient.    Genelle Gather Arnitra Sokoloski  02/20/2015    

## 2015-03-15 ENCOUNTER — Other Ambulatory Visit: Payer: Self-pay | Admitting: Oncology

## 2015-07-28 ENCOUNTER — Other Ambulatory Visit: Payer: Self-pay | Admitting: Family Medicine

## 2015-07-28 DIAGNOSIS — E2839 Other primary ovarian failure: Secondary | ICD-10-CM

## 2015-07-28 DIAGNOSIS — M8580 Other specified disorders of bone density and structure, unspecified site: Secondary | ICD-10-CM

## 2015-08-26 ENCOUNTER — Ambulatory Visit
Admission: RE | Admit: 2015-08-26 | Discharge: 2015-08-26 | Disposition: A | Discharge status: Home / Self Care | Payer: Medicare Other | Source: Ambulatory Visit | Attending: Family Medicine | Admitting: Family Medicine

## 2015-08-26 DIAGNOSIS — E2839 Other primary ovarian failure: Secondary | ICD-10-CM

## 2016-02-26 ENCOUNTER — Other Ambulatory Visit: Payer: Self-pay | Admitting: Family Medicine

## 2016-02-26 DIAGNOSIS — Z853 Personal history of malignant neoplasm of breast: Secondary | ICD-10-CM

## 2016-03-08 ENCOUNTER — Ambulatory Visit
Admission: RE | Admit: 2016-03-08 | Discharge: 2016-03-08 | Disposition: A | Discharge status: Home / Self Care | Payer: Medicare Other | Source: Ambulatory Visit | Attending: Family Medicine | Admitting: Family Medicine

## 2016-03-08 DIAGNOSIS — Z853 Personal history of malignant neoplasm of breast: Secondary | ICD-10-CM

## 2017-04-19 ENCOUNTER — Other Ambulatory Visit: Payer: Self-pay | Admitting: Family Medicine

## 2017-04-19 DIAGNOSIS — Z853 Personal history of malignant neoplasm of breast: Secondary | ICD-10-CM

## 2017-04-27 ENCOUNTER — Ambulatory Visit
Admission: RE | Admit: 2017-04-27 | Discharge: 2017-04-27 | Disposition: A | Discharge status: Home / Self Care | Payer: Medicare Other | Source: Ambulatory Visit | Attending: Family Medicine | Admitting: Family Medicine

## 2017-04-27 DIAGNOSIS — Z853 Personal history of malignant neoplasm of breast: Secondary | ICD-10-CM

## 2017-04-27 HISTORY — DX: Personal history of irradiation: Z92.3

## 2017-04-27 HISTORY — DX: Malignant neoplasm of unspecified site of unspecified female breast: C50.919

## 2017-04-27 HISTORY — DX: Personal history of antineoplastic chemotherapy: Z92.21

## 2017-12-06 ENCOUNTER — Ambulatory Visit
Admission: RE | Admit: 2017-12-06 | Discharge: 2017-12-06 | Disposition: A | Discharge status: Home / Self Care | Payer: Medicare Other | Source: Ambulatory Visit | Attending: Physician Assistant | Admitting: Physician Assistant

## 2017-12-06 ENCOUNTER — Other Ambulatory Visit: Payer: Self-pay | Admitting: Physician Assistant

## 2017-12-06 DIAGNOSIS — M25551 Pain in right hip: Secondary | ICD-10-CM

## 2018-05-15 ENCOUNTER — Other Ambulatory Visit: Payer: Self-pay | Admitting: Family Medicine

## 2018-05-15 DIAGNOSIS — Z1231 Encounter for screening mammogram for malignant neoplasm of breast: Secondary | ICD-10-CM

## 2018-06-25 ENCOUNTER — Ambulatory Visit
Admission: RE | Admit: 2018-06-25 | Discharge: 2018-06-25 | Disposition: A | Discharge status: Home / Self Care | Payer: Medicare Other | Source: Ambulatory Visit | Attending: Family Medicine | Admitting: Family Medicine

## 2018-06-25 DIAGNOSIS — Z1231 Encounter for screening mammogram for malignant neoplasm of breast: Secondary | ICD-10-CM

## 2019-07-25 ENCOUNTER — Other Ambulatory Visit: Payer: Self-pay | Admitting: Family Medicine

## 2019-07-25 DIAGNOSIS — M8580 Other specified disorders of bone density and structure, unspecified site: Secondary | ICD-10-CM

## 2019-07-25 DIAGNOSIS — Z1231 Encounter for screening mammogram for malignant neoplasm of breast: Secondary | ICD-10-CM

## 2019-10-11 ENCOUNTER — Other Ambulatory Visit: Payer: Self-pay

## 2019-10-11 ENCOUNTER — Ambulatory Visit
Admission: RE | Admit: 2019-10-11 | Discharge: 2019-10-11 | Disposition: A | Discharge status: Home / Self Care | Payer: Medicare Other | Source: Ambulatory Visit | Attending: Family Medicine | Admitting: Family Medicine

## 2019-10-11 DIAGNOSIS — Z1231 Encounter for screening mammogram for malignant neoplasm of breast: Secondary | ICD-10-CM

## 2019-10-11 DIAGNOSIS — M8580 Other specified disorders of bone density and structure, unspecified site: Secondary | ICD-10-CM

## 2020-10-15 ENCOUNTER — Other Ambulatory Visit: Payer: Self-pay | Admitting: Family Medicine

## 2020-10-15 DIAGNOSIS — Z1231 Encounter for screening mammogram for malignant neoplasm of breast: Secondary | ICD-10-CM

## 2020-12-08 ENCOUNTER — Ambulatory Visit
Admission: RE | Admit: 2020-12-08 | Discharge: 2020-12-08 | Disposition: A | Discharge status: Home / Self Care | Payer: Medicare Other | Source: Ambulatory Visit | Attending: Family Medicine | Admitting: Family Medicine

## 2020-12-08 ENCOUNTER — Other Ambulatory Visit: Payer: Self-pay

## 2020-12-08 DIAGNOSIS — Z1231 Encounter for screening mammogram for malignant neoplasm of breast: Secondary | ICD-10-CM

## 2021-07-06 ENCOUNTER — Other Ambulatory Visit: Payer: Self-pay | Admitting: Family Medicine

## 2021-07-06 DIAGNOSIS — Z1231 Encounter for screening mammogram for malignant neoplasm of breast: Secondary | ICD-10-CM

## 2021-07-06 DIAGNOSIS — M8580 Other specified disorders of bone density and structure, unspecified site: Secondary | ICD-10-CM

## 2021-12-13 ENCOUNTER — Ambulatory Visit
Admission: RE | Admit: 2021-12-13 | Discharge: 2021-12-13 | Disposition: A | Discharge status: Home / Self Care | Payer: Medicare Other | Source: Ambulatory Visit | Attending: Family Medicine | Admitting: Family Medicine

## 2021-12-13 DIAGNOSIS — Z1231 Encounter for screening mammogram for malignant neoplasm of breast: Secondary | ICD-10-CM

## 2021-12-13 DIAGNOSIS — M8580 Other specified disorders of bone density and structure, unspecified site: Secondary | ICD-10-CM

## 2023-01-09 ENCOUNTER — Encounter: Payer: Self-pay | Admitting: Family Medicine

## 2023-07-13 ENCOUNTER — Other Ambulatory Visit: Payer: Self-pay | Admitting: Family Medicine

## 2023-07-13 DIAGNOSIS — Z1231 Encounter for screening mammogram for malignant neoplasm of breast: Secondary | ICD-10-CM

## 2023-08-10 ENCOUNTER — Ambulatory Visit
Admission: RE | Admit: 2023-08-10 | Discharge: 2023-08-10 | Disposition: A | Discharge status: Home / Self Care | Payer: Medicare Other | Source: Ambulatory Visit | Attending: Family Medicine | Admitting: Family Medicine

## 2023-08-10 DIAGNOSIS — Z1231 Encounter for screening mammogram for malignant neoplasm of breast: Secondary | ICD-10-CM
